# Patient Record
Sex: Female | Born: 1965
Health system: Southern US, Community
[De-identification: ages and names within clinical notes are randomized; demographics above are authoritative.]

## PROBLEM LIST (undated history)

## (undated) HISTORY — PX: ABDOMINAL HYSTERECTOMY: SHX81

## (undated) HISTORY — PX: PELVIC FLOOR REPAIR: SHX2192

## (undated) HISTORY — PX: VAGINAL HYSTERECTOMY: SUR661

## (undated) HISTORY — PX: TOTAL ABDOMINAL HYSTERECTOMY: SHX209

---

## 2010-03-30 ENCOUNTER — Ambulatory Visit: Payer: Self-pay | Admitting: Diagnostic Radiology

## 2010-03-30 ENCOUNTER — Emergency Department (HOSPITAL_BASED_OUTPATIENT_CLINIC_OR_DEPARTMENT_OTHER): Admission: EM | Admit: 2010-03-30 | Discharge: 2010-03-30 | Payer: Self-pay | Admitting: Emergency Medicine

## 2011-09-18 ENCOUNTER — Inpatient Hospital Stay (INDEPENDENT_AMBULATORY_CARE_PROVIDER_SITE_OTHER)
Admission: RE | Admit: 2011-09-18 | Discharge: 2011-09-18 | Disposition: A | Discharge status: Home / Self Care | Payer: Managed Care, Other (non HMO) | Source: Ambulatory Visit | Attending: Family Medicine | Admitting: Family Medicine

## 2011-09-18 ENCOUNTER — Encounter: Payer: Self-pay | Admitting: Family Medicine

## 2011-09-18 DIAGNOSIS — J069 Acute upper respiratory infection, unspecified: Secondary | ICD-10-CM

## 2011-11-09 NOTE — Progress Notes (Signed)
Summary: Possible Sinus Infection Room 5   Vital Signs:  Patient Profile:   45 Years Old Female CC:      Congestion since yesterday Height:     61 inches Weight:      143 pounds O2 Sat:      100 % O2 treatment:    Room Air Temp:     98.4 degrees F oral Pulse rate:   86 / minute Pulse rhythm:   regular Resp:     12 per minute BP sitting:   124 / 78  (left arm) Cuff size:   regular  Vitals Entered By: Emilio Math (September 18, 2011 2:26 PM)                  Current Allergies: No known allergies History of Present Illness Chief Complaint: Congestion since yesterday History of Present Illness:  Subjective: Patient complains of mild sore throat that started 2 days ago, now resolved + cough started yesterday No pleuritic pain No wheezing + nasal congestion No post-nasal drainage + sinus pain/pressure No itchy/red eyes ? right earache No hemoptysis No SOB No fever/chills but felt hot last night No nausea No vomiting No abdominal pain No diarrhea No skin rashes + fatigue No myalgias No headache Used OTC meds without relief   REVIEW OF SYSTEMS Constitutional Symptoms      Denies fever, chills, night sweats, weight loss, weight gain, and fatigue.  Eyes       Denies change in vision, eye pain, eye discharge, glasses, contact lenses, and eye surgery. Ear/Nose/Throat/Mouth       Complains of sinus problems.      Denies hearing loss/aids, change in hearing, ear pain, ear discharge, dizziness, frequent runny nose, frequent nose bleeds, sore throat, hoarseness, and tooth pain or bleeding.  Respiratory       Denies dry cough, productive cough, wheezing, shortness of breath, asthma, bronchitis, and emphysema/COPD.  Cardiovascular       Denies murmurs, chest pain, and tires easily with exhertion.    Gastrointestinal       Denies stomach pain, nausea/vomiting, diarrhea, constipation, blood in bowel movements, and indigestion. Genitourniary       Denies painful  urination, kidney stones, and loss of urinary control. Neurological       Denies paralysis, seizures, and fainting/blackouts. Musculoskeletal       Denies muscle pain, joint pain, joint stiffness, decreased range of motion, redness, swelling, muscle weakness, and gout.  Skin       Denies bruising, unusual mles/lumps or sores, and hair/skin or nail changes.  Psych       Denies mood changes, temper/anger issues, anxiety/stress, speech problems, depression, and sleep problems.  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  Extraocular movement is intact.  Conjunctivae are not inflamed.  Ears:  Canals normal.  Tympanic membranes normal.   Nose:  Mildly congested turbinates, worse on the right.  No sinus tenderness  Pharynx:  Normal  Neck:  Supple.  Slightly tender shotty posterior nodes are palpated bilaterally.  Lungs:  Clear to auscultation.  Breath sounds are equal.  Heart:  Regular rate and rhythm without murmurs, rubs, or gallops.  Abdomen:  Nontender without masses or hepatosplenomegaly.  Bowel sounds are present.  No CVA or flank tenderness.  Assessment New Problems: UPPER RESPIRATORY INFECTION, ACUTE (ICD-465.9)  NO EVIDENCE BACTERIAL INFECTION TODAY  Plan New Medications/Changes: BENZONATATE 200 MG CAPS (BENZONATATE) One by mouth hs as needed cough  #12 x 0, 09/18/2011, Donna Christen MD AZITHROMYCIN 250 MG TABS (AZITHROMYCIN) Two tabs by mouth on day 1, then 1 tab daily on days 2 through 5 (Rx void after 09/26/11)  #6 tabs x 0, 09/18/2011, Donna Christen MD  New Orders: Pulse Oximetry (single measurment) [40981] New Patient Level III [19147] Planning  Comments:   Treat symptomatically for now:  Increase fluid intake, begin expectorant/decongestant, topical decongestant,  cough suppressant at bedtime.  If fever/chills/sweats persist, or if not improving 5  days begin Z-pack (given Rx to hold).  Followup with PCP if not improving 7 to 10 days.   The patient and/or caregiver has been counseled thoroughly with regard to medications prescribed including dosage, schedule, interactions, rationale for use, and possible side effects and they verbalize understanding.  Diagnoses and expected course of recovery discussed and will return if not improved as expected or if the condition worsens. Patient and/or caregiver verbalized understanding.  Prescriptions: BENZONATATE 200 MG CAPS (BENZONATATE) One by mouth hs as needed cough  #12 x 0   Entered and Authorized by:   Donna Christen MD   Signed by:   Donna Christen MD on 09/18/2011   Method used:   Print then Give to Patient   RxID:   [id number] AZITHROMYCIN 250 MG TABS (AZITHROMYCIN) Two tabs by mouth on day 1, then 1 tab daily on days 2 through 5 (Rx void after 09/26/11)  #6 tabs x 0   Entered and Authorized by:   Donna Christen MD   Signed by:   Donna Christen MD on 09/18/2011   Method used:   Print then Give to Patient   RxID:   [id number]   Patient Instructions: 1)  Take Mucinex D (guaifenesin with decongestant) twice daily for congestion. 2)  Increase fluid intake, rest. 3)  May use Afrin nasal spray (or generic oxymetazoline) twice daily for about 5 days.  Also recommend using saline nasal spray several times daily and/or saline nasal irrigation. 4)  Begin Azithromycin if not improving about 5 days or if persistent fever develops. 5)  Followup with family doctor if not improving 7 to 10 days.   Orders Added: 1)  Pulse Oximetry (single measurment) [94760] 2)  New Patient Level III [27253]

## 2012-09-09 DIAGNOSIS — I341 Nonrheumatic mitral (valve) prolapse: Secondary | ICD-10-CM | POA: Insufficient documentation

## 2014-10-19 ENCOUNTER — Encounter: Payer: Self-pay | Admitting: Physician Assistant

## 2014-10-19 ENCOUNTER — Ambulatory Visit (INDEPENDENT_AMBULATORY_CARE_PROVIDER_SITE_OTHER): Payer: BC Managed Care – PPO | Admitting: Physician Assistant

## 2014-10-19 VITALS — BP 119/76 | HR 81 | Ht 61.0 in | Wt 153.0 lb

## 2014-10-19 DIAGNOSIS — Z1211 Encounter for screening for malignant neoplasm of colon: Secondary | ICD-10-CM | POA: Diagnosis not present

## 2014-10-19 DIAGNOSIS — K644 Residual hemorrhoidal skin tags: Secondary | ICD-10-CM | POA: Insufficient documentation

## 2014-10-19 DIAGNOSIS — K648 Other hemorrhoids: Secondary | ICD-10-CM | POA: Diagnosis not present

## 2014-10-19 DIAGNOSIS — K219 Gastro-esophageal reflux disease without esophagitis: Secondary | ICD-10-CM | POA: Diagnosis not present

## 2014-10-19 DIAGNOSIS — K6289 Other specified diseases of anus and rectum: Secondary | ICD-10-CM

## 2014-10-19 MED ORDER — LIDOCAINE-HYDROCORTISONE ACE 3-1 % RE KIT: 1.0000 "application " | PACK | Freq: Two times a day (BID) | RECTAL | Status: DC

## 2014-10-19 MED ORDER — RANITIDINE HCL 150 MG PO CAPS: 150.0000 mg | ORAL_CAPSULE | Freq: Two times a day (BID) | ORAL | Status: DC

## 2014-10-19 NOTE — Patient Instructions (Signed)
Will get colonoscopy scheduled.   Anal Fissure, Adult An anal fissure is a small tear or crack in the skin around the anus. Bleeding from a fissure usually stops on its own within a few minutes. However, bleeding will often reoccur with each bowel movement until the crack heals.  CAUSES   Passing large, hard stools.  Frequent diarrheal stools.  Constipation.  Inflammatory bowel disease (Crohn's disease or ulcerative colitis).  Infections.  Anal sex. SYMPTOMS   Small amounts of blood seen on your stools, on toilet paper, or in the toilet after a bowel movement.  Rectal bleeding.  Painful bowel movements.  Itching or irritation around the anus. DIAGNOSIS Your caregiver will examine the anal area. An anal fissure can usually be seen with careful inspection. A rectal exam may be performed and a short tube (anoscope) may be used to examine the anal canal. TREATMENT   You may be instructed to take fiber supplements. These supplements can soften your stool to help make bowel movements easier.  Sitz baths may be recommended to help heal the tear. Do not use soap in the sitz baths.  A medicated cream or ointment may be prescribed to lessen discomfort. HOME CARE INSTRUCTIONS   Maintain a diet high in fruits, whole grains, and vegetables. Avoid constipating foods like bananas and dairy products.  Take sitz baths as directed by your caregiver.  Drink enough fluids to keep your urine clear or pale yellow.  Only take over-the-counter or prescription medicines for pain, discomfort, or fever as directed by your caregiver. Do not take aspirin as this may increase bleeding.  Do not use ointments containing numbing medications (anesthetics) or hydrocortisone. They could slow healing. SEEK MEDICAL CARE IF:   Your fissure is not completely healed within 3 days.  You have further bleeding.  You have a fever.  You have diarrhea mixed with blood.  You have pain.  Your problem is  getting worse rather than better. MAKE SURE YOU:   Understand these instructions.  Will watch your condition.  Will get help right away if you are not doing well or get worse. Document Released: 11/23/2005 Document Revised: 02/15/2012 Document Reviewed: 05/10/2011 [facility] Patient Information 2015 [place], [place]. This information is not intended to replace advice given to you by your health care provider. Make sure you discuss any questions you have with your health care provider.

## 2014-10-22 DIAGNOSIS — K6289 Other specified diseases of anus and rectum: Secondary | ICD-10-CM | POA: Insufficient documentation

## 2014-10-22 NOTE — Progress Notes (Signed)
   Subjective:    Patient ID: Donna Ford, female    DOB: [birth year], 48 y.o.   MRN: [id number]  HPI Patient is a 48 year old female who presents to the clinic to establish care.  .. Active Ambulatory Problems    Diagnosis Date Noted  . Esophageal reflux 10/19/2014  . External hemorrhoid 10/19/2014   Resolved Ambulatory Problems    Diagnosis Date Noted  . No Resolved Ambulatory Problems   No Additional Past Medical History   .Marland Kitchen Family History  Problem Relation Age of Onset  . Heart attack Father 15    open heart at 14  . Hypertension Father    .Marland Kitchen History   Social History  . Marital Status: Married    Spouse Name: N/A    Number of Children: N/A  . Years of Education: N/A   Occupational History  . Not on file.   Social History Main Topics  . Smoking status: Never Smoker   . Smokeless tobacco: Not on file  . Alcohol Use: No  . Drug Use: No  . Sexual Activity: Yes   Other Topics Concern  . Not on file   Social History Narrative  . No narrative on file   She is most concerned with the rectal pain she has been having on and off for the last 6 months. It has worsened over the last couple weeks. She has a history of hemorrhoids. She also has some constipation. MiraLAX seems to make her have diarrhea. She has tried Metamucil and seems to help significantly with softer normal bowel movements. She admits to some itching and pain. She is not tried anything over-the-counter or use any prescription creams. She denies any blood in stool.  Patient also has acid reflux symptoms which she takes Zantac over-the-counter as needed. Seems to help her significantly. She would like a prescription for this.   Review of Systems  All other systems reviewed and are negative.      Objective:   Physical Exam  Constitutional: She is oriented to person, place, and time. She appears well-developed and well-nourished.  HENT:  Head: Normocephalic and atraumatic.  Cardiovascular: Normal  rate and normal heart sounds.   Pulmonary/Chest: Effort normal and breath sounds normal.  Genitourinary: Guaiac negative stool.  Small external hemorrhoid at 1 oclock. Painful DRE. No masses palpated.   Neurological: She is alert and oriented to person, place, and time.  Skin: Skin is dry.  Psychiatric: She has a normal mood and affect. Her behavior is normal.          Assessment & Plan:  External hemorrhoids/rectal pain/constipation- hemoccult negative today. cannot completely exclude some anal fissures as well. Lidocaine/hydrocortisone given to try for relief. Discussed stools. Seem to be good with metamucil. Discussed other medication options if would like to try. miralax creates diarrhea. Concerned linzess/amitiza might as well. Continue metamucil for time being.  Colonoscopy ordered today.  Esophageal reflux- controlled with zantac as needed. Follow up if needs PPI for symptoms control.

## 2014-11-12 ENCOUNTER — Telehealth: Payer: Self-pay | Admitting: Physician Assistant

## 2014-11-16 ENCOUNTER — Encounter: Payer: BC Managed Care – PPO | Admitting: Physician Assistant

## 2015-02-05 NOTE — Telephone Encounter (Signed)
No note. Needs closed.

## 2015-07-22 ENCOUNTER — Encounter: Payer: Self-pay | Admitting: *Deleted

## 2015-07-22 ENCOUNTER — Emergency Department (INDEPENDENT_AMBULATORY_CARE_PROVIDER_SITE_OTHER): Payer: 59

## 2015-07-22 ENCOUNTER — Emergency Department
Admission: EM | Admit: 2015-07-22 | Discharge: 2015-07-22 | Disposition: A | Discharge status: Home / Self Care | Payer: 59 | Source: Home / Self Care | Attending: Family Medicine | Admitting: Family Medicine

## 2015-07-22 DIAGNOSIS — R0981 Nasal congestion: Secondary | ICD-10-CM | POA: Diagnosis not present

## 2015-07-22 MED ORDER — FLUTICASONE PROPIONATE 50 MCG/ACT NA SUSP: NASAL | Status: DC

## 2015-07-22 MED ORDER — AMOXICILLIN 875 MG PO TABS: 875.0000 mg | ORAL_TABLET | Freq: Two times a day (BID) | ORAL | Status: DC

## 2015-07-22 MED ORDER — PREDNISONE 20 MG PO TABS: 20.0000 mg | ORAL_TABLET | Freq: Two times a day (BID) | ORAL | Status: DC

## 2015-07-22 NOTE — ED Notes (Signed)
Pt c/o intermittent sinus congestion and pain x 3 months. Taken neti pot , Claritin, decongestants and Nasacort without relief.

## 2015-07-22 NOTE — Discharge Instructions (Signed)
May use Afrin nasal spray (or generic oxymetazoline) twice daily for about 5 days and then discontinue.  Also recommend using saline nasal spray several times daily and saline nasal irrigation (AYR is a common brand).  Use Flonase nasal spray each morning after using Afrin nasal spray and saline nasal irrigation. Alternate antihistamines on a monthly basis (such as Claritin, Allegra, and Zyrtec).

## 2015-07-22 NOTE — ED Provider Notes (Signed)
CSN: [id number]     Arrival date & time 07/22/15  0761 History   First MD Initiated Contact with Patient 07/22/15 1015     Chief Complaint  Patient presents with  . Sinus Problem      HPI Comments: Patient complains of approximately 5 month history of intermittent recurring sinus pressure and nasal congestion.  Her symptoms improve somewhat with Flonase.  No cough or sore throat  The history is provided by the patient.    History reviewed. No pertinent past medical history. Past Surgical History  Procedure Laterality Date  . Abdominal hysterectomy     Family History  Problem Relation Age of Onset  . Heart attack Father 108    open heart at 24  . Hypertension Father    Social History  Substance Use Topics  . Smoking status: Never Smoker   . Smokeless tobacco: None  . Alcohol Use: No   OB History    No data available     Review of Systems No sore throat No cough No pleuritic pain No wheezing + nasal congestion + post-nasal drainage + sinus pain/pressure No itchy/red eyes, but has had clear eye drainage No earache No hemoptysis No SOB No fever, + chills No nausea No vomiting No abdominal pain No diarrhea No urinary symptoms No skin rash No fatigue No myalgias No headache Used OTC meds without relief  Allergies  Review of patient's allergies indicates no known allergies.  Home Medications   Prior to Admission medications   Medication Sig Start Date End Date Taking? Authorizing Provider  Multiple Vitamin (MULTIVITAMIN) tablet Take 1 tablet by mouth daily.   Yes Historical Provider, MD  ranitidine (ZANTAC) 150 MG capsule Take 1 capsule (150 mg total) by mouth 2 (two) times daily. 10/19/14  Yes Jade L Breeback, PA-C  amoxicillin (AMOXIL) 875 MG tablet Take 1 tablet (875 mg total) by mouth 2 (two) times daily. 07/22/15   Kandra Nicolas, MD  fluticasone Asencion Islam) 50 MCG/ACT nasal spray Place two sprays in each nostril once daily 07/22/15   Kandra Nicolas, MD   lidocaine-hydrocortisone (ANAMANTLE) 3-1 % KIT Place 1 application rectally 2 (two) times daily. 10/19/14   Jade L Breeback, PA-C  predniSONE (DELTASONE) 20 MG tablet Take 1 tablet (20 mg total) by mouth 2 (two) times daily. Take with food. 07/22/15   Kandra Nicolas, MD   BP 107/71 mmHg  Pulse 75  Temp(Src) 98.5 F (36.9 C) (Oral)  Resp 14  Wt 154 lb (69.854 kg)  SpO2 97% Physical Exam Nursing notes and Vital Signs reviewed. Appearance:  Patient appears stated age, and in no acute distress Eyes:  Pupils are equal, round, and reactive to light and accomodation.  Extraocular movement is intact.  Conjunctivae are not inflamed  Ears:  Canals normal.  Tympanic membranes normal.  Nose:  Congested turbinates.  Maxillary sinus tenderness is present. Pharynx:  Normal Neck:  Supple.  No adenopathy Lungs:  Clear to auscultation.  Breath sounds are equal.  Moving air well. Heart:  Regular rate and rhythm without murmurs, rubs, or gallops.  Abdomen:  Nontender without masses or hepatosplenomegaly.  Bowel sounds are present.  No CVA or flank tenderness.  Extremities:  No edema.  Skin:  No rash present.   ED Course  Procedures none  Imaging Review Dg Sinuses Complete  07/22/2015   CLINICAL DATA:  Sinus congestion and pressure for 5 months  EXAM: PARANASAL SINUSES - COMPLETE 3 + VIEW  COMPARISON:  None.  FINDINGS: Frontal, water's, and lateral views obtained. Paranasal sinuses are clear. There is no air-fluid level. No bony destruction or expansion. Mastoids also appear clear. Nasal septum is midline.  IMPRESSION: Paranasal sinuses clear.   Electronically Signed   By: Lowella Grip III M.D.   On: 07/22/2015 10:46     MDM   1. Sinus congestion    Begin prednisone burst.  [facility] Rx for amoxicillin $RemoveBeforeD'875mg'IxVhilRrZyZkkm$  BID for 10 days.  Rx for Flonase nasal spray.  May use Afrin nasal spray (or generic oxymetazoline) twice daily for about 5 days and then discontinue.  Also recommend using saline nasal  spray several times daily and saline nasal irrigation (AYR is a common brand).  Use Flonase nasal spray each morning after using Afrin nasal spray and saline nasal irrigation. Alternate antihistamines on a monthly basis (such as Claritin, Allegra, and Zyrtec). Followup with ENT if not improved 10 days.    Kandra Nicolas, MD 07/22/15 [phone]

## 2015-08-07 ENCOUNTER — Ambulatory Visit (INDEPENDENT_AMBULATORY_CARE_PROVIDER_SITE_OTHER): Payer: 59 | Admitting: Physician Assistant

## 2015-08-07 ENCOUNTER — Encounter: Payer: Self-pay | Admitting: Physician Assistant

## 2015-08-07 VITALS — BP 122/75 | HR 78 | Ht 61.0 in | Wt 154.0 lb

## 2015-08-07 DIAGNOSIS — Z114 Encounter for screening for human immunodeficiency virus [HIV]: Secondary | ICD-10-CM

## 2015-08-07 DIAGNOSIS — Z13220 Encounter for screening for lipoid disorders: Secondary | ICD-10-CM | POA: Diagnosis not present

## 2015-08-07 DIAGNOSIS — Z131 Encounter for screening for diabetes mellitus: Secondary | ICD-10-CM

## 2015-08-07 DIAGNOSIS — Z1159 Encounter for screening for other viral diseases: Secondary | ICD-10-CM | POA: Diagnosis not present

## 2015-08-07 DIAGNOSIS — Z1239 Encounter for other screening for malignant neoplasm of breast: Secondary | ICD-10-CM

## 2015-08-07 DIAGNOSIS — N959 Unspecified menopausal and perimenopausal disorder: Secondary | ICD-10-CM

## 2015-08-07 DIAGNOSIS — Z0000 Encounter for general adult medical examination without abnormal findings: Secondary | ICD-10-CM | POA: Diagnosis not present

## 2015-08-07 DIAGNOSIS — J309 Allergic rhinitis, unspecified: Secondary | ICD-10-CM

## 2015-08-07 DIAGNOSIS — R0981 Nasal congestion: Secondary | ICD-10-CM

## 2015-08-07 MED ORDER — AZELASTINE-FLUTICASONE 137-50 MCG/ACT NA SUSP: NASAL | Status: DC

## 2015-08-07 MED ORDER — ESCITALOPRAM OXALATE 10 MG PO TABS
10.0000 mg | ORAL_TABLET | Freq: Every day | ORAL | Status: DC
Start: 2015-08-07 — End: 2016-02-06

## 2015-08-07 NOTE — Progress Notes (Signed)
  Subjective:     Donna Ford is a 49 y.o. female and is here for a comprehensive physical exam. The patient reports problems - pt has had a lot of nasal congestion and sinus pressure over the summer. she recently was given prednisone, nasal spray and abx. there has been some improvement but continues to have nasal congestion and hard to breathe. she was on allergy medication but recently stopped. pt also having hot flashes and increased anxiety. she wonders what she can do with symptoms. .  Social History   Social History  . Marital Status: Married    Spouse Name: N/A  . Number of Children: N/A  . Years of Education: N/A   Occupational History  . Not on file.   Social History Main Topics  . Smoking status: Never Smoker   . Smokeless tobacco: Not on file  . Alcohol Use: No  . Drug Use: No  . Sexual Activity: Yes   Other Topics Concern  . Not on file   Social History Narrative   Health Maintenance  Topic Date Due  . Hepatitis C Screening  Aug 08, 1966  . HIV Screening  06/27/1979  . INFLUENZA VACCINE  07/08/2015  . MAMMOGRAM  01/08/2016  . TETANUS/TDAP  12/08/2023  . COLONOSCOPY  05/14/2025    The following portions of the patient's history were reviewed and updated as appropriate: allergies, current medications, past family history, past medical history, past social history, past surgical history and problem list.  Review of Systems A comprehensive review of systems was negative.   Objective:    BP 122/75 mmHg  Pulse 78  Ht 5\' 1"  (1.549 m)  Wt 154 lb (69.854 kg)  BMI 29.11 kg/m2 General appearance: alert, cooperative and appears stated age Head: Normocephalic, without obvious abnormality, atraumatic Eyes: conjunctivae/corneas clear. PERRL, EOM's intact. Fundi benign. Ears: normal TM's and external ear canals both ears Nose: Nares normal. Septum midline. Mucosa normal. No drainage or sinus tenderness., right turbinate red, swollen almost completely shut. Throat:  lips, mucosa, and tongue normal; teeth and gums normal Neck: no adenopathy, no carotid bruit, no JVD, supple, symmetrical, trachea midline and thyroid not enlarged, symmetric, no tenderness/mass/nodules Back: symmetric, no curvature. ROM normal. No CVA tenderness. Lungs: clear to auscultation bilaterally Heart: regular rate and rhythm, S1, S2 normal, no murmur, click, rub or gallop Abdomen: soft, non-tender; bowel sounds normal; no masses,  no organomegaly Pelvic: external genitalia normal, no adnexal masses or tenderness, no cervical motion tenderness, uterus surgically absent and vagina normal without discharge Extremities: extremities normal, atraumatic, no cyanosis or edema Pulses: 2+ and symmetric Skin: Skin color, texture, turgor normal. No rashes or lesions Lymph nodes: Cervical, supraclavicular, and axillary nodes normal. Neurologic: Grossly normal    Assessment:    Healthy female exam.      Plan:    CPE- fasting labs ordered. Will get flu shot at work. Mammogram ordered. Colonoscopy up to date. Discussed vitamin D 800 and calcium 1500mg  or 4 servings of daily. Continue regular exercise.   Post-menopausal symptoms- given HO of herbal remedies. Started lexapro for mood. Start with 1/2 tablet and may increase to 1 full tablet. Follow up as needed.   Allergic rhinitis- try dymista. If not improving can consider ENT referral. No evidence of deviated septum today. Re-start allergy medication and continue nasal saline washes.  See After Visit Summary for Counseling Recommendations

## 2015-08-07 NOTE — Patient Instructions (Addendum)
Keeping You Healthy  Get These Tests  Blood Pressure- Have your blood pressure checked by your healthcare provider at least once a year.  Normal blood pressure is 120/80.  Weight- Have your body mass index (BMI) calculated to screen for obesity.  BMI is a measure of body fat based on height and weight.  You can calculate your own BMI at www.nhlbisupport.com/bmi/  Cholesterol- Have your cholesterol checked every year.  Diabetes- Have your blood sugar checked every year if you have high blood pressure, high cholesterol, a family history of diabetes or if you are overweight.  Pap Test - Have a pap test every 1 to 5 years if you have been sexually active.  If you are older than 65 and recent pap tests have been normal you may not need additional pap tests.  In addition, if you have had a hysterectomy  for benign disease additional pap tests are not necessary.  Mammogram-Yearly mammograms are essential for early detection of breast cancer  Screening for Colon Cancer- Colonoscopy starting at age 50. Screening may begin sooner depending on your family history and other health conditions.  Follow up colonoscopy as directed by your Gastroenterologist.  Screening for Osteoporosis- Screening begins at age 65 with bone density scanning, sooner if you are at higher risk for developing Osteoporosis.  Get these medicines  Calcium with Vitamin D- Your body requires 1200-1500 mg of Calcium a day and 800-1000 IU of Vitamin D a day.  You can only absorb 500 mg of Calcium at a time therefore Calcium must be taken in 2 or 3 separate doses throughout the day.  Hormones- Hormone therapy has been associated with increased risk for certain cancers and heart disease.  Talk to your healthcare provider about if you need relief from menopausal symptoms.  Aspirin- Ask your healthcare provider about taking Aspirin to prevent Heart Disease and Stroke.  Get these Immuniztions  Flu shot- Every fall  Pneumonia shot-  Once after the age of 65; if you are younger ask your healthcare provider if you need a pneumonia shot.  Tetanus- Every ten years.  Zostavax- Once after the age of 60 to prevent shingles.  Take these steps  Don't smoke- Your healthcare provider can help you quit. For tips on how to quit, ask your healthcare provider or go to www.smokefree.gov or call 1-800 QUIT-NOW.  Be physically active- Exercise 5 days a week for a minimum of 30 minutes.  If you are not already physically active, start slow and gradually work up to 30 minutes of moderate physical activity.  Try walking, dancing, bike riding, swimming, etc.  Eat a healthy diet- Eat a variety of healthy foods such as fruits, vegetables, whole grains, low fat milk, low fat cheeses, yogurt, lean meats, chicken, fish, eggs, dried beans, tofu, etc.  For more information go to www.thenutritionsource.org  Dental visit- Brush and floss teeth twice daily; visit your dentist twice a year.  Eye exam- Visit your Optometrist or Ophthalmologist yearly.  Drink alcohol in moderation- Limit alcohol intake to one drink or less a day.  Never drink and drive.  Depression- Your emotional health is as important as your physical health.  If you're feeling down or losing interest in things you normally enjoy, please talk to your healthcare provider.  Seat Belts- can save your life; always wear one  Smoke/Carbon Monoxide detectors- These detectors need to be installed on the appropriate level of your home.  Replace batteries at least once a year.  Violence- If   anyone is threatening or hurting you, please tell your healthcare provider.  Living Will/ Health care power of attorney- Discuss with your healthcare provider and family.  Menopause and Herbal Products Menopause is the normal time of life when menstrual periods stop completely. Menopause is complete when you have missed 12 consecutive menstrual periods. It usually occurs between the ages of 51 to 89,  with an average age of 56. Very rarely does a woman develop menopause before 51 years old. At menopause, your ovaries stop producing the female hormones, estrogen and progesterone. This can cause undesirable symptoms and also affect your health. Sometimes the symptoms can occur 4 to 5 years before the menopause begins. There is no relationship between menopause and:  Oral contraceptives.  Number of children you had.  Race.  The age your menstrual periods started (menarche). Heavy smokers and very thin women may develop menopause earlier in life. Estrogen and progesterone hormone treatment is the usual method of treating menopausal symptoms. However, there are women who should not take hormone treatment. This is true of:   Women that have breast or uterine cancer.  Women who prefer not to take hormones because of certain side effects (abnormal uterine bleeding).  Women who are afraid that hormones may cause breast cancer.  Women who have a history of liver disease, heart disease, stroke, or blood clots. For these women, there are other medications that may help treat their menopausal symptoms. These medications are found in plants and botanical products. They can be found in the form of herbs, teas, oils, tinctures, and pills.  CAUSES:  The ovaries stop producing the female hormones estrogen and progesterone.  Other causes include:  Surgery to remove both ovaries.  The ovaries stop functioning for no know reason.  Tumors of the pituitary gland in the brain.  Medical disease that affects the ovaries and hormone production.  Radiation treatment to the abdomen or pelvis.  Chemotherapy that affects the ovaries. PHYTOESTROGENS: Phytoestrogens occur naturally in plants and plant products. They act like estrogen in the body. Herbal medications are made from these plants and botanical steroids. There are 3 types of phytoestrogens:  Isoflavones (genistein and daidzein) are found in soy,  garbanzo beans, miso and tofu foods.  Ligins are found in the shell of seeds. They are used to make oils like flaxseed oil. The bacteria in your intestine act on these foods to produce the estrogen-like hormones.  Coumestans are estrogen-like. Some of the foods they are found in include sunflower seeds and bean sprouts. CONDITIONS AND THEIR POSSIBLE HERBAL TREATMENT:  Hot flashes and night sweats.  Soy, black cohosh and evening primrose.  Irritability, insomnia, depression and memory problems.  Chasteberry, ginseng, and soy.  St. John's wort may be helpful for depression. However, there is a concern of it causing cataracts of the eye and may have bad effects on other medications. St. John's wort should not be taken for long time and without your caregiver's advice.  Loss of libido and vaginal and skin dryness.  Wild yam and soy.  Prevention of coronary heart disease and osteoporosis.  Soy and Isoflavones. Several studies have shown that some women benefit from herbal medications, but most of the studies have not consistently shown that these supplements are much better than placebo. Other forms of treatment to help women with menopausal symptoms include a balanced diet, rest, exercise, vitamin and calcium (with vitamin D) supplements, acupuncture, and group therapy when necessary. THOSE WHO SHOULD NOT TAKE HERBAL MEDICATIONS INCLUDE:  Women who are planning on getting pregnant unless told by your caregiver.  Women who are breastfeeding unless told by your caregiver.  Women who are taking other prescription medications unless told by your caregiver.  Infants, children, and elderly women unless told by your caregiver. Different herbal medications have different and unmeasured amounts of the herbal ingredients. There are no regulations, quality control, and standardization of the ingredients in herbal medications. Therefore, the amount of the ingredient in the medication may vary from  one herb, pill, tea, oil or tincture to another. Many herbal medications can cause serious problems and can even have poisonous effects if taken too much or too long. If problems develop, the medication should be stopped and recorded by your caregiver. HOME CARE INSTRUCTIONS  Do not take or give children herbal medications without your caregiver's advice.  Let your caregiver know all the medications you are taking. This includes prescription, over-the-counter, eye drops, and creams.  Do not take herbal medications longer or more than recommended.  Tell your caregiver about any side effects from the medication. SEEK MEDICAL CARE IF:  You develop a fever of 102 F (38.9 C), or as directed by your caregiver.  You feel sick to your stomach (nauseous), vomit, or have diarrhea.  You develop a rash.  You develop abdominal pain.  You develop severe headaches.  You start to have vision problems.  You feel dizzy or faint.  You start to feel numbness in any part of your body.  You start shaking (have convulsions). Document Released: 05/11/2008 Document Revised: 11/09/2012 Document Reviewed: 12/09/2010 [facility] Patient Information 2015 [place], [place]. This information is not intended to replace advice given to you by your health care provider. Make sure you discuss any questions you have with your health care provider.

## 2015-10-09 ENCOUNTER — Ambulatory Visit (INDEPENDENT_AMBULATORY_CARE_PROVIDER_SITE_OTHER): Payer: 59

## 2015-10-09 DIAGNOSIS — Z1231 Encounter for screening mammogram for malignant neoplasm of breast: Secondary | ICD-10-CM

## 2015-10-09 DIAGNOSIS — Z1239 Encounter for other screening for malignant neoplasm of breast: Secondary | ICD-10-CM

## 2015-10-10 ENCOUNTER — Encounter: Payer: Self-pay | Admitting: Physician Assistant

## 2015-10-10 DIAGNOSIS — E785 Hyperlipidemia, unspecified: Secondary | ICD-10-CM | POA: Insufficient documentation

## 2015-10-10 LAB — HIV ANTIBODY (ROUTINE TESTING W REFLEX): HIV: NONREACTIVE

## 2015-10-10 LAB — COMPLETE METABOLIC PANEL WITH GFR
ALT: 19 U/L (ref 6–29)
AST: 20 U/L (ref 10–35)
Albumin: 3.9 g/dL (ref 3.6–5.1)
Alkaline Phosphatase: 68 U/L (ref 33–130)
BUN: 12 mg/dL (ref 7–25)
CALCIUM: 8.9 mg/dL (ref 8.6–10.4)
CHLORIDE: 103 mmol/L (ref 98–110)
CO2: 27 mmol/L (ref 20–31)
CREATININE: 0.7 mg/dL (ref 0.50–1.05)
GFR, Est African American: >89 mL/min (ref >=60)
GFR, Est Non African American: >89 mL/min (ref >=60)
GLUCOSE: 83 mg/dL (ref 65–99)
POTASSIUM: 4.5 mmol/L (ref 3.5–5.3)
SODIUM: 138 mmol/L (ref 135–146)
Total Bilirubin: 0.6 mg/dL (ref 0.2–1.2)
Total Protein: 6.2 g/dL (ref 6.1–8.1)

## 2015-10-10 LAB — LIPID PANEL
CHOL/HDL RATIO: 6.1 ratio — AB (ref <=5.0)
CHOLESTEROL: 279 mg/dL — AB (ref 125–200)
HDL: 46 mg/dL (ref >=46)
LDL Cholesterol: 197 mg/dL — ABNORMAL HIGH (ref <130)
Triglycerides: 181 mg/dL — ABNORMAL HIGH (ref <150)
VLDL: 36 mg/dL — AB (ref <30)

## 2015-10-10 LAB — HEPATITIS C ANTIBODY: HCV Ab: NEGATIVE

## 2015-10-18 ENCOUNTER — Other Ambulatory Visit: Payer: Self-pay | Admitting: Physician Assistant

## 2015-10-18 MED ORDER — ATORVASTATIN CALCIUM 40 MG PO TABS: 40.0000 mg | ORAL_TABLET | Freq: Every day | ORAL | Status: DC

## 2015-10-21 ENCOUNTER — Encounter: Payer: Self-pay | Admitting: Physician Assistant

## 2015-10-21 ENCOUNTER — Ambulatory Visit (INDEPENDENT_AMBULATORY_CARE_PROVIDER_SITE_OTHER): Payer: 59 | Admitting: Physician Assistant

## 2015-10-21 VITALS — BP 125/68 | HR 75 | Ht 61.0 in | Wt 155.0 lb

## 2015-10-21 DIAGNOSIS — J301 Allergic rhinitis due to pollen: Secondary | ICD-10-CM

## 2015-10-21 DIAGNOSIS — J309 Allergic rhinitis, unspecified: Secondary | ICD-10-CM | POA: Diagnosis not present

## 2015-10-21 DIAGNOSIS — R0981 Nasal congestion: Secondary | ICD-10-CM | POA: Diagnosis not present

## 2015-10-21 MED ORDER — MONTELUKAST SODIUM 10 MG PO TABS: 10.0000 mg | ORAL_TABLET | Freq: Every day | ORAL | Status: DC

## 2015-10-21 MED ORDER — PREDNISONE 20 MG PO TABS: ORAL_TABLET | ORAL | Status: DC

## 2015-10-21 NOTE — Progress Notes (Signed)
   Subjective:    Patient ID: Donna Ford, female    DOB: Jun 10, 1966, 49 y.o.   MRN: [id number]  HPI  Patient is a 49 year old female who presents to the clinic with sinus pressure, congestion, tooth pain that has worsened over the last week that been present since May. She had x-rays of the sinuses which showed no air-fluid levels when she went to urgent care. She saw me a few weeks ago and was given.Mr. She did feel like that helped until recently. She gets better over the weekends and when she's off work and worsens throughout the week. She feels like there is a certain room at work that has some mold might be exacerbating symptoms. She is not on any antihistamine at this time. Today her headache is pounding. It hurts to bend over. She has tried 600 mg of ibuprofen. She denies any rhinorrhea, cough, fever, chills, ear pain    Review of Systems  All other systems reviewed and are negative.      Objective:   Physical Exam  Constitutional: She is oriented to person, place, and time. She appears well-developed and well-nourished.  HENT:  Head: Normocephalic and atraumatic.  Right Ear: External ear normal.  Left Ear: External ear normal.  Mouth/Throat: Oropharynx is clear and moist. No oropharyngeal exudate.  TM's clear bilaterally.  Left maxillary tenderness to palpation.  Bilateral nasal turbinates red and swollen.   Eyes:  Dark circles around both eyes.  Slightly injected conjunctiva bilateral.   Neck: Normal range of motion. Neck supple.  Cardiovascular: Normal rate, regular rhythm and normal heart sounds.   Pulmonary/Chest: Effort normal and breath sounds normal.  Lymphadenopathy:    She has no cervical adenopathy.  Neurological: She is alert and oriented to person, place, and time.  Skin: Skin is dry.  Psychiatric: She has a normal mood and affect. Her behavior is normal.          Assessment & Plan:  Allergic sinusitis/allergic rhinitis- I do not see any causes of  bacterial sinusitis today. Will hold on antibiotic. Started prednisone taper today. I advised patient to start either Zyrtec, Claritin or Allegra at bedtime. I did send over Singulair for her to start daily as well. Continue with dymista daily. I'm making a referral for an allergist. I do think there is some correlation with allergy and possible mold at work. If sinus pressure continues with other symptoms can consider adding abx.

## 2015-10-21 NOTE — Patient Instructions (Signed)
dymista singulair Zyrtec/clariting/allegra D

## 2015-12-20 DIAGNOSIS — J301 Allergic rhinitis due to pollen: Secondary | ICD-10-CM | POA: Diagnosis not present

## 2016-01-06 DIAGNOSIS — J342 Deviated nasal septum: Secondary | ICD-10-CM | POA: Diagnosis not present

## 2016-01-06 DIAGNOSIS — J310 Chronic rhinitis: Secondary | ICD-10-CM | POA: Diagnosis not present

## 2016-01-06 DIAGNOSIS — J309 Allergic rhinitis, unspecified: Secondary | ICD-10-CM | POA: Diagnosis not present

## 2016-01-06 DIAGNOSIS — J343 Hypertrophy of nasal turbinates: Secondary | ICD-10-CM | POA: Diagnosis not present

## 2016-01-06 MED FILL — AZELASTINE 0.1% (137 MCG) S: 0.1 | 30 days supply | Qty: 30 | Fill #0

## 2016-01-07 MED FILL — MONTELUKAST SOD 10 MG TAB: 10 | 30 days supply | Qty: 30 | Fill #1

## 2016-02-06 ENCOUNTER — Other Ambulatory Visit: Payer: Self-pay | Admitting: *Deleted

## 2016-02-06 MED ORDER — ESCITALOPRAM OXALATE 10 MG PO TABS: 10.0000 mg | ORAL_TABLET | Freq: Every day | ORAL | Status: DC

## 2016-02-06 MED FILL — ESCITALOPRAM 10 MG TABLET: 10 | 30 days supply | Qty: 30 | Fill #0

## 2016-02-14 MED FILL — MONTELUKAST SOD 10 MG TAB: 10 | 30 days supply | Qty: 30 | Fill #2

## 2016-03-24 MED FILL — ESCITALOPRAM 10 MG TABLET: 10 | 30 days supply | Qty: 30 | Fill #1

## 2016-04-07 ENCOUNTER — Encounter: Payer: Self-pay | Admitting: *Deleted

## 2016-04-07 ENCOUNTER — Emergency Department
Admission: EM | Admit: 2016-04-07 | Discharge: 2016-04-07 | Disposition: A | Discharge status: Home / Self Care | Payer: 59 | Source: Home / Self Care | Attending: Family Medicine | Admitting: Family Medicine

## 2016-04-07 DIAGNOSIS — H1089 Other conjunctivitis: Secondary | ICD-10-CM | POA: Diagnosis not present

## 2016-04-07 MED ORDER — KETOROLAC TROMETHAMINE 0.5 % OP SOLN: 1.0000 [drp] | Freq: Four times a day (QID) | OPHTHALMIC | Status: DC

## 2016-04-07 NOTE — Discharge Instructions (Signed)
May begin using drops in right eye if right eye irritation develops.

## 2016-04-07 NOTE — ED Notes (Signed)
Pt c/o LT eye yellow drainage, redness, itching and watering x 2 days. No contacts. Denies recent URI. Hx of seasonal allergies.

## 2016-04-07 NOTE — ED Provider Notes (Signed)
CSN: [id number]     Arrival date & time 04/07/16  1805 History   First MD Initiated Contact with Patient 04/07/16 1839     Chief Complaint  Patient presents with  . Eye Drainage      HPI Comments: Patient reports that she developed left eye redness, itching, and increased lacrimation two days ago.  No foreign body sensation.  No recent URI.  She does have a history of environmental allergies.  No symptoms in her right eye.  No changes in vision.  Patient is a 50 y.o. female presenting with eye problem. The history is provided by the patient.  Eye Problem Location:  L eye Severity:  Mild Onset quality:  Sudden Duration:  2 days Timing:  Constant Progression:  Unchanged Chronicity:  New Context: not burn, not chemical exposure, not contact lens problem, not direct trauma, not foreign body, not scratch and not smoke exposure   Relieved by:  None tried Worsened by:  Nothing tried Ineffective treatments:  None tried Associated symptoms: discharge, inflammation, itching, redness and tearing   Associated symptoms: no blurred vision, no crusting, no decreased vision, no double vision, no facial rash, no foreign body sensation, no headaches, no photophobia and no swelling   Risk factors: no recent URI     History reviewed. No pertinent past medical history. Past Surgical History  Procedure Laterality Date  . Abdominal hysterectomy     Family History  Problem Relation Age of Onset  . Heart attack Father 35    open heart at 58  . Hypertension Father    Social History  Substance Use Topics  . Smoking status: Never Smoker   . Smokeless tobacco: None  . Alcohol Use: No   OB History    No data available     Review of Systems  Eyes: Positive for discharge, redness and itching. Negative for blurred vision, double vision and photophobia.  Neurological: Negative for headaches.  All other systems reviewed and are negative.   Allergies  Review of patient's allergies indicates no  known allergies.  Home Medications   Prior to Admission medications   Medication Sig Start Date End Date Taking? Authorizing Provider  atorvastatin (LIPITOR) 40 MG tablet Take 1 tablet (40 mg total) by mouth daily. 10/18/15   Jade L Breeback, PA-C  Azelastine-Fluticasone 137-50 MCG/ACT SUSP 1 spray each nostril twice a day. Patient not taking: Reported on 10/21/2015 08/07/15   Jade L Breeback, PA-C  escitalopram (LEXAPRO) 10 MG tablet Take 1 tablet (10 mg total) by mouth daily. 02/06/16   Jade L Breeback, PA-C  fluticasone (FLONASE) 50 MCG/ACT nasal spray Place two sprays in each nostril once daily 07/22/15   Kandra Nicolas, MD  ketorolac (ACULAR) 0.5 % ophthalmic solution Place 1 drop into the left eye 4 (four) times daily. 04/07/16   Kandra Nicolas, MD  montelukast (SINGULAIR) 10 MG tablet Take 1 tablet (10 mg total) by mouth at bedtime. 10/21/15   Donella Stade, PA-C  Multiple Vitamin (MULTIVITAMIN) tablet Take 1 tablet by mouth daily.    Historical Provider, MD  predniSONE (DELTASONE) 20 MG tablet Take 3 tablets for 3 days, take 2 tablets for 3 days, take 1 tablet for 3 days, take 1/2 tablet for 4 days. 10/21/15   Donella Stade, PA-C   Meds Ordered and Administered this Visit  Medications - No data to display  BP 112/74 mmHg  Pulse 69  Temp(Src) 97.7 F (36.5 C) (Oral)  Resp 16  Ht 5' (1.524 m)  Wt 155 lb (70.308 kg)  BMI 30.27 kg/m2  SpO2 96% No data found.   Physical Exam  Constitutional: She is oriented to person, place, and time. She appears well-developed and well-nourished. No distress.  HENT:  Head: Atraumatic.  Right Ear: External ear normal.  Left Ear: External ear normal.  Mouth/Throat: Oropharynx is clear and moist.  Eyes: Conjunctivae and EOM are normal. Pupils are equal, round, and reactive to light. Right eye exhibits no discharge. Left eye exhibits no discharge.  Neck: Neck supple.  Slightly enlarged tender posterior/lateral nodes are palpated.   Cardiovascular: Normal heart sounds.   Pulmonary/Chest: Breath sounds normal.  Abdominal: There is no tenderness.  Lymphadenopathy:    She has cervical adenopathy.  Neurological: She is alert and oriented to person, place, and time.  Skin: Skin is warm and dry. No rash noted.  Nursing note and vitals reviewed.   ED Course  Procedures none   MDM   1. Other conjunctivitis; ?viral vs allergic    Begin Acular ophthalmic suspension. May begin using drops in right eye if right eye irritation develops. Followup with ophthalmologist if not improving 3 days.    Kandra Nicolas, MD 04/14/16 2037

## 2016-04-08 MED FILL — KETOROLAC 0.5% OPHTH SOLN: 0.5 | 25 days supply | Qty: 5 | Fill #0

## 2016-04-27 ENCOUNTER — Other Ambulatory Visit: Payer: Self-pay | Admitting: Physician Assistant

## 2016-05-07 MED FILL — ESCITALOPRAM 10 MG TABLET: 10 | 30 days supply | Qty: 30 | Fill #2

## 2016-05-08 DIAGNOSIS — L570 Actinic keratosis: Secondary | ICD-10-CM | POA: Diagnosis not present

## 2016-05-08 DIAGNOSIS — Z85828 Personal history of other malignant neoplasm of skin: Secondary | ICD-10-CM | POA: Diagnosis not present

## 2016-05-08 DIAGNOSIS — Z08 Encounter for follow-up examination after completed treatment for malignant neoplasm: Secondary | ICD-10-CM | POA: Diagnosis not present

## 2016-06-02 DIAGNOSIS — B351 Tinea unguium: Secondary | ICD-10-CM | POA: Insufficient documentation

## 2016-06-04 DIAGNOSIS — H9203 Otalgia, bilateral: Secondary | ICD-10-CM | POA: Diagnosis not present

## 2016-06-23 ENCOUNTER — Emergency Department
Admission: EM | Admit: 2016-06-23 | Discharge: 2016-06-23 | Disposition: A | Discharge status: Home / Self Care | Payer: 59 | Source: Home / Self Care | Attending: Family Medicine | Admitting: Family Medicine

## 2016-06-23 DIAGNOSIS — S39012A Strain of muscle, fascia and tendon of lower back, initial encounter: Secondary | ICD-10-CM | POA: Diagnosis not present

## 2016-06-23 MED ORDER — CYCLOBENZAPRINE HCL 10 MG PO TABS: 10.0000 mg | ORAL_TABLET | Freq: Three times a day (TID) | ORAL | Status: DC | PRN

## 2016-06-23 MED ORDER — PREDNISONE 20 MG PO TABS: ORAL_TABLET | ORAL | Status: DC

## 2016-06-23 MED ORDER — HYDROCODONE-ACETAMINOPHEN 5-325 MG PO TABS: ORAL_TABLET | ORAL | Status: DC

## 2016-06-23 NOTE — ED Notes (Signed)
Pt had virus Sunday night, N, V, D.  Back started hurting around 1 am Tuesday, took tylenol at 5 am.  At 6 she got in the shower, and bent over to wash her feet.  Had a sharp pain from hip up to the mid back.  Denies numbness and tingling in legs, but says legs feel weak like she is going to fall.  The pain is the greatest when she is lifting her right leg.

## 2016-06-23 NOTE — Discharge Instructions (Signed)
Apply ice pack for 20 to 30 minutes, 3 to 4 times daily  Continue until pain decreases.  Begin back range of motion and stretching exercises as tolerated.   Lumbosacral Strain Lumbosacral strain is a strain of any of the parts that make up your lumbosacral vertebrae. Your lumbosacral vertebrae are the bones that make up the lower third of your backbone. Your lumbosacral vertebrae are held together by muscles and tough, fibrous tissue (ligaments).  CAUSES  A sudden blow to your back can cause lumbosacral strain. Also, anything that causes an excessive stretch of the muscles in the low back can cause this strain. This is typically seen when people exert themselves strenuously, fall, lift heavy objects, bend, or crouch repeatedly. RISK FACTORS  Physically demanding work.  Participation in pushing or pulling sports or sports that require a sudden twist of the back (tennis, golf, baseball).  Weight lifting.  Excessive lower back curvature.  Forward-tilted pelvis.  Weak back or abdominal muscles or both.  Tight hamstrings. SIGNS AND SYMPTOMS  Lumbosacral strain may cause pain in the area of your injury or pain that moves (radiates) down your leg.  DIAGNOSIS Your health care provider can often diagnose lumbosacral strain through a physical exam. In some cases, you may need tests such as X-ray exams.  TREATMENT  Treatment for your lower back injury depends on many factors that your clinician will have to evaluate. However, most treatment will include the use of anti-inflammatory medicines. HOME CARE INSTRUCTIONS   Avoid hard physical activities (tennis, racquetball, waterskiing) if you are not in proper physical condition for it. This may aggravate or create problems.  If you have a back problem, avoid sports requiring sudden body movements. Swimming and walking are generally safer activities.  Maintain good posture.  Maintain a healthy weight.  For acute conditions, you may put ice on  the injured area.  Put ice in a plastic bag.  Place a towel between your skin and the bag.  Leave the ice on for 20 minutes, 2-3 times a day.  When the low back starts healing, stretching and strengthening exercises may be recommended. SEEK MEDICAL CARE IF:  Your back pain is getting worse.  You experience severe back pain not relieved with medicines. SEEK IMMEDIATE MEDICAL CARE IF:   You have numbness, tingling, weakness, or problems with the use of your arms or legs.  There is a change in bowel or bladder control.  You have increasing pain in any area of the body, including your belly (abdomen).  You notice shortness of breath, dizziness, or feel faint.  You feel sick to your stomach (nauseous), are throwing up (vomiting), or become sweaty.  You notice discoloration of your toes or legs, or your feet get very cold. MAKE SURE YOU:   Understand these instructions.  Will watch your condition.  Will get help right away if you are not doing well or get worse.   This information is not intended to replace advice given to you by your health care provider. Make sure you discuss any questions you have with your health care provider.   Document Released: 09/02/2005 Document Revised: 12/14/2014 Document Reviewed: 07/12/2013 Elsevier Interactive Patient Education [employer].

## 2016-06-23 NOTE — ED Provider Notes (Signed)
CSN: [id number]     Arrival date & time 06/23/16  1942 History   None    Chief Complaint  Patient presents with  . Back Pain      HPI Comments: Patient reports that she had a "stomach virus" two days ago that resolved.  Last night she noticed vague mild lower back ache.  This morning while in the shower, she bent over to wash her feet and suddenly developed a sharp pain in her right lower back/buttock.  The pain does not radiate.   She denies bowel or bladder dysfunction, and no saddle numbness.  The pain is worse when she walks, and lifts her right leg.    Patient is a 50 y.o. female presenting with back pain. The history is provided by the patient and the spouse.  Back Pain Location:  Gluteal region Quality:  Stabbing Radiates to:  Does not radiate Pain severity:  Moderate Pain is:  Same all the time Onset quality:  Sudden Duration:  14 hours Timing:  Constant Progression:  Unchanged Chronicity:  New Context comment:  Simply bending over in shower Relieved by:  Nothing Worsened by:  Standing (lifting right leg) Ineffective treatments:  None tried Associated symptoms: no abdominal pain, no abdominal swelling, no bladder incontinence, no bowel incontinence, no chest pain, no dysuria, no fever, no leg pain, no numbness, no paresthesias, no pelvic pain, no perianal numbness, no tingling, no weakness and no weight loss     History reviewed. No pertinent past medical history. Past Surgical History  Procedure Laterality Date  . Abdominal hysterectomy     Family History  Problem Relation Age of Onset  . Heart attack Father 4    open heart at 20  . Hypertension Father    Social History  Substance Use Topics  . Smoking status: Never Smoker   . Smokeless tobacco: None  . Alcohol Use: No   OB History    No data available     Review of Systems  Constitutional: Negative for fever and weight loss.  Cardiovascular: Negative for chest pain.  Gastrointestinal: Negative for  abdominal pain and bowel incontinence.  Genitourinary: Negative for bladder incontinence, dysuria and pelvic pain.  Musculoskeletal: Positive for back pain.  Neurological: Negative for tingling, weakness, numbness and paresthesias.  All other systems reviewed and are negative.   Allergies  Review of patient's allergies indicates no known allergies.  Home Medications   Prior to Admission medications   Medication Sig Start Date End Date Taking? Authorizing Provider  atorvastatin (LIPITOR) 40 MG tablet Take 1 tablet (40 mg total) by mouth daily. 10/18/15   Jade L Breeback, PA-C  Azelastine-Fluticasone 137-50 MCG/ACT SUSP 1 spray each nostril twice a day. Patient not taking: Reported on 10/21/2015 08/07/15   Donella Stade, PA-C  cyclobenzaprine (FLEXERIL) 10 MG tablet Take 1 tablet (10 mg total) by mouth 3 (three) times daily as needed for muscle spasms. 06/23/16   Kandra Nicolas, MD  escitalopram (LEXAPRO) 10 MG tablet Take 1 tablet (10 mg total) by mouth daily. 02/06/16   Donella Stade, PA-C  fluticasone (FLONASE) 50 MCG/ACT nasal spray Place two sprays in each nostril once daily 07/22/15   Kandra Nicolas, MD  HYDROcodone-acetaminophen (NORCO/VICODIN) 5-325 MG tablet Take one by mouth at bedtime as needed for pain 06/23/16   Kandra Nicolas, MD  ketorolac (ACULAR) 0.5 % ophthalmic solution Place 1 drop into the left eye 4 (four) times daily. 04/07/16   Kandra Nicolas,  MD  montelukast (SINGULAIR) 10 MG tablet Take 1 tablet (10 mg total) by mouth at bedtime. 10/21/15   Donella Stade, PA-C  Multiple Vitamin (MULTIVITAMIN) tablet Take 1 tablet by mouth daily.    Historical Provider, MD  predniSONE (DELTASONE) 20 MG tablet Take one tab by mouth twice daily for 5 days, then one daily. Take with food. 06/23/16   Kandra Nicolas, MD   Meds Ordered and Administered this Visit  Medications - No data to display  BP 118/82 mmHg  Pulse 74  Temp(Src) 98.4 F (36.9 C) (Oral)  Ht 5\' 1"  (1.549 m)  Wt  146 lb (66.225 kg)  BMI 27.60 kg/m2  SpO2 98% No data found.   Physical Exam  Constitutional: She is oriented to person, place, and time. She appears well-developed and well-nourished. No distress.  HENT:  Head: Atraumatic.  Nose: Nose normal.  Mouth/Throat: Oropharynx is clear and moist.  Eyes: Conjunctivae are normal. Pupils are equal, round, and reactive to light.  Neck: Neck supple.  Cardiovascular: Normal heart sounds and intact distal pulses.   Pulmonary/Chest: Breath sounds normal.  Abdominal: There is no tenderness.  Musculoskeletal: She exhibits no edema.       Lumbar back: She exhibits decreased range of motion. She exhibits no tenderness, no bony tenderness and no swelling.       Back:  Area of pain outlined in blue on diagram, but no tenderness to palpation at this location.  Back:  Decreased range of motion. Straight leg raising test is negative.  Sitting knee extension test is negative.  Strength and sensation in the lower extremities is normal.  Patellar and achilles reflexes are normal   Lymphadenopathy:    She has no cervical adenopathy.  Neurological: She is alert and oriented to person, place, and time. She has normal reflexes.  Skin: Skin is warm and dry. No rash noted.  Nursing note and vitals reviewed.   ED Course  Procedures none    MDM   1. Low back strain, initial encounter    With a history of GERD, will begin prednisone burst/taper rather than NSAID.  Rx for Flexeril.  Lortab for pain at bedtime. Apply ice pack for 20 to 30 minutes, 3 to 4 times daily  Continue until pain decreases.  Begin back range of motion and stretching exercises as tolerated. Followup with Dr. Aundria Mems or Dr. Lynne Leader ([facility]) if not improving about two weeks.     Kandra Nicolas, MD 06/26/16 1011

## 2016-07-13 ENCOUNTER — Other Ambulatory Visit: Payer: Self-pay | Admitting: Physician Assistant

## 2016-07-13 MED ORDER — ESCITALOPRAM OXALATE 10 MG PO TABS: 10.0000 mg | ORAL_TABLET | Freq: Every day | ORAL | 0 refills | Status: DC

## 2016-07-13 MED FILL — ESCITALOPRAM 10 MG TABLET: 10 | 30 days supply | Qty: 30 | Fill #0

## 2016-08-05 ENCOUNTER — Encounter: Payer: Self-pay | Admitting: Physician Assistant

## 2016-08-05 ENCOUNTER — Ambulatory Visit (INDEPENDENT_AMBULATORY_CARE_PROVIDER_SITE_OTHER): Payer: 59 | Admitting: Physician Assistant

## 2016-08-05 VITALS — BP 130/64 | HR 77 | Ht 61.0 in | Wt 150.0 lb

## 2016-08-05 DIAGNOSIS — N959 Unspecified menopausal and perimenopausal disorder: Secondary | ICD-10-CM

## 2016-08-05 DIAGNOSIS — E785 Hyperlipidemia, unspecified: Secondary | ICD-10-CM | POA: Diagnosis not present

## 2016-08-05 DIAGNOSIS — R0981 Nasal congestion: Secondary | ICD-10-CM | POA: Diagnosis not present

## 2016-08-05 MED ORDER — ESCITALOPRAM OXALATE 10 MG PO TABS
10.0000 mg | ORAL_TABLET | Freq: Every day | ORAL | 1 refills | Status: DC
Start: 2016-08-05 — End: 2017-04-14

## 2016-08-05 MED ORDER — ATORVASTATIN CALCIUM 40 MG PO TABS: 40.0000 mg | ORAL_TABLET | Freq: Every day | ORAL | 1 refills | Status: DC

## 2016-08-05 NOTE — Patient Instructions (Signed)
ASA 81mg  daily.  Start lipitor.  Continue lexapro.

## 2016-08-05 NOTE — Progress Notes (Signed)
   Subjective:    Patient ID: Donna Ford, female    DOB: 08-29-1966, 50 y.o.   MRN: [id number]  HPI Patient is a 50 year old female who presents to the clinic for follow-up. It has been over a year since the last follow-up. She had extremely high LDL over year ago. Her LDL was 197. She admits she never started Lipitor. She was overwhelmed with everything going on with her nasal congestion and allergic rhinitis that she did not want to start any new medications. She is taking a baby aspirin a day. She was started on Lexapro for her postmenopausal mood changes and hot flashes. That has significantly helped her. She was out of it for about a month and she noticed that her mood and hot flashes increase. She would like to stay on that today.   Review of Systems See history of present illness    Objective:   Physical Exam  Constitutional: She is oriented to person, place, and time. She appears well-developed and well-nourished.  HENT:  Head: Normocephalic and atraumatic.  Neck: Normal range of motion. Neck supple.  Cardiovascular: Normal rate, regular rhythm and normal heart sounds.   Pulmonary/Chest: Effort normal and breath sounds normal.  Lymphadenopathy:    She has no cervical adenopathy.  Neurological: She is alert and oriented to person, place, and time.  Psychiatric: She has a normal mood and affect. Her behavior is normal.          Assessment & Plan:  Post menopausal symptoms-refilled Lexapro for 6 months. I would like for patient to return to clinic in 6 months for overall recheck.  Hyperlipidemia-please start Lipitor. Discussed with an LDL that high at the cardiac risk that puts her in risk of. Continue with baby aspirin daily. We'll recheck cholesterol in 6 months. I recommended a complete physical in 6 months.  Patient declined flu shot today.

## 2016-08-14 MED FILL — ATORVASTATIN 40 MG TABLET: 40 | 90 days supply | Qty: 90 | Fill #0

## 2016-08-14 MED FILL — ESCITALOPRAM 10 MG TABLET: 10 | 90 days supply | Qty: 90 | Fill #0

## 2016-09-08 ENCOUNTER — Other Ambulatory Visit: Payer: Self-pay | Admitting: Physician Assistant

## 2016-09-08 DIAGNOSIS — Z1231 Encounter for screening mammogram for malignant neoplasm of breast: Secondary | ICD-10-CM

## 2016-10-02 ENCOUNTER — Encounter: Payer: Self-pay | Admitting: Physician Assistant

## 2016-10-08 DIAGNOSIS — H5203 Hypermetropia, bilateral: Secondary | ICD-10-CM | POA: Diagnosis not present

## 2016-10-08 DIAGNOSIS — H524 Presbyopia: Secondary | ICD-10-CM | POA: Diagnosis not present

## 2016-10-14 ENCOUNTER — Ambulatory Visit (INDEPENDENT_AMBULATORY_CARE_PROVIDER_SITE_OTHER): Payer: 59

## 2016-10-14 DIAGNOSIS — Z1231 Encounter for screening mammogram for malignant neoplasm of breast: Secondary | ICD-10-CM | POA: Diagnosis not present

## 2016-11-24 ENCOUNTER — Ambulatory Visit (INDEPENDENT_AMBULATORY_CARE_PROVIDER_SITE_OTHER): Payer: 59 | Admitting: Family Medicine

## 2016-11-24 ENCOUNTER — Encounter: Payer: Self-pay | Admitting: Family Medicine

## 2016-11-24 VITALS — BP 118/68 | HR 80 | Temp 98.1°F | Wt 153.0 lb

## 2016-11-24 DIAGNOSIS — R51 Headache: Secondary | ICD-10-CM

## 2016-11-24 DIAGNOSIS — R519 Headache, unspecified: Secondary | ICD-10-CM

## 2016-11-24 LAB — POCT INFLUENZA A/B
Influenza A, POC: NEGATIVE
Influenza B, POC: NEGATIVE

## 2016-11-24 NOTE — Progress Notes (Signed)
       Donna Ford is a 50 y.o. female who presents to [facility]: [place] today for headache and body aches and fatigue. Symptoms present for 2 days. No vomiting diarrhea chest pain palpitations or shortness of breath. Patient has tried some ibuprofen and Tylenol which have helped a bit. She feels well otherwise. She received a flu vaccine earlier this year.   No past medical history on file. Past Surgical History:  Procedure Laterality Date  . ABDOMINAL HYSTERECTOMY     Social History  Substance Use Topics  . Smoking status: Never Smoker  . Smokeless tobacco: Not on file  . Alcohol use No   family history includes Heart attack (age of onset: 53) in her father; Hypertension in her father.  ROS as above:  Medications: Current Outpatient Prescriptions  Medication Sig Dispense Refill  . atorvastatin (LIPITOR) 40 MG tablet Take 1 tablet (40 mg total) by mouth daily. 90 tablet 1  . escitalopram (LEXAPRO) 10 MG tablet Take 1 tablet (10 mg total) by mouth daily. 90 tablet 1  . Multiple Vitamin (MULTIVITAMIN) tablet Take 1 tablet by mouth daily.     No current facility-administered medications for this visit.    No Known Allergies  Health Maintenance Health Maintenance  Topic Date Due  . INFLUENZA VACCINE  08/05/2017 (Originally 07/07/2016)  . MAMMOGRAM  10/14/2018  . TETANUS/TDAP  12/08/2023  . COLONOSCOPY  05/14/2025  . Hepatitis C Screening  Completed  . HIV Screening  Completed     Exam:  BP 118/68   Pulse 80   Temp 98.1 F (36.7 C) (Oral)   Wt 153 lb (69.4 kg)   SpO2 96%   BMI 28.91 kg/m  Gen: Well NAD Nontoxic appearing HEENT: EOMI,  MMM posterior pharynx with mild cobblestoning. Normal tympanic membranes bilaterally. Mild cervical lymphadenopathy present bilaterally. No meningismus Lungs: Normal work of breathing. CTABL Heart: RRR no MRG Abd: NABS, Soft.  Nondistended, Nontender Exts: Brisk capillary refill, warm and well perfused.     Results for orders placed or performed in visit on 11/24/16 (from the past 72 hour(s))  POCT Influenza A/B     Status: Normal   Collection Time: 11/24/16  2:39 PM  Result Value Ref Range   Influenza A, POC Negative Negative   Influenza B, POC Negative Negative   No results found.    Assessment and Plan: 50 y.o. female with Viral illness with headaches and bodyaches. Flu test negative. Plan for watchful waiting with over-the-counter medications as needed for symptom control. Return as needed.   Orders Placed This Encounter  Procedures  . POCT Influenza A/B    Discussed warning signs or symptoms. Please see discharge instructions. Patient expresses understanding.

## 2016-11-24 NOTE — Patient Instructions (Signed)
Thank you for coming in today. Continue over the counter medicine as needed for symptoms.  Return as needed.  You should start to feel better in a a few days.  You can take up to 2 aleve twice daily or 2 extra strength tylenol every 6 hours for pain.    General Headache Without Cause Introduction A headache is pain or discomfort felt around the head or neck area. There are many causes and types of headaches. In some cases, the cause may not be found. Follow these instructions at home: Managing pain  Take over-the-counter and prescription medicines only as told by your doctor.  Lie down in a dark, quiet room when you have a headache.  If directed, apply ice to the head and neck area:  Put ice in a plastic bag.  Place a towel between your skin and the bag.  Leave the ice on for 20 minutes, 2-3 times per day.  Use a heating pad or hot shower to apply heat to the head and neck area as told by your doctor.  Keep lights dim if bright lights bother you or make your headaches worse. Eating and drinking  Eat meals on a regular schedule.  Lessen how much alcohol you drink.  Lessen how much caffeine you drink, or stop drinking caffeine. General instructions  Keep all follow-up visits as told by your doctor. This is important.  Keep a journal to find out if certain things bring on headaches. For example, write down:  What you eat and drink.  How much sleep you get.  Any change to your diet or medicines.  Relax by getting a massage or doing other relaxing activities.  Lessen stress.  Sit up straight. Do not tighten (tense) your muscles.  Do not use tobacco products. This includes cigarettes, chewing tobacco, or e-cigarettes. If you need help quitting, ask your doctor.  Exercise regularly as told by your doctor.  Get enough sleep. This often means 7-9 hours of sleep. Contact a doctor if:  Your symptoms are not helped by medicine.  You have a headache that feels  different than the other headaches.  You feel sick to your stomach (nauseous) or you throw up (vomit).  You have a fever. Get help right away if:  Your headache becomes really bad.  You keep throwing up.  You have a stiff neck.  You have trouble seeing.  You have trouble speaking.  You have pain in the eye or ear.  Your muscles are weak or you lose muscle control.  You lose your balance or have trouble walking.  You feel like you will pass out (faint) or you pass out.  You have confusion. This information is not intended to replace advice given to you by your health care provider. Make sure you discuss any questions you have with your health care provider. Document Released: 09/01/2008 Document Revised: 04/30/2016 Document Reviewed: 03/18/2015  2017 Elsevier

## 2016-11-25 MED FILL — ESCITALOPRAM 10 MG TABLET: 10 | 90 days supply | Qty: 90 | Fill #1

## 2017-01-14 DIAGNOSIS — D045 Carcinoma in situ of skin of trunk: Secondary | ICD-10-CM | POA: Diagnosis not present

## 2017-04-14 ENCOUNTER — Other Ambulatory Visit: Payer: Self-pay | Admitting: Physician Assistant

## 2017-04-15 DIAGNOSIS — L570 Actinic keratosis: Secondary | ICD-10-CM | POA: Diagnosis not present

## 2017-04-15 MED FILL — ESCITALOPRAM 10 MG TABLET: 10 | 15 days supply | Qty: 15 | Fill #0

## 2017-05-12 ENCOUNTER — Ambulatory Visit (INDEPENDENT_AMBULATORY_CARE_PROVIDER_SITE_OTHER): Payer: 59 | Admitting: Physician Assistant

## 2017-05-12 ENCOUNTER — Encounter: Payer: Self-pay | Admitting: Physician Assistant

## 2017-05-12 VITALS — BP 121/70 | HR 76 | Ht 61.0 in | Wt 149.0 lb

## 2017-05-12 DIAGNOSIS — E785 Hyperlipidemia, unspecified: Secondary | ICD-10-CM | POA: Diagnosis not present

## 2017-05-12 DIAGNOSIS — M6530 Trigger finger, unspecified finger: Secondary | ICD-10-CM

## 2017-05-12 DIAGNOSIS — Z79899 Other long term (current) drug therapy: Secondary | ICD-10-CM

## 2017-05-12 DIAGNOSIS — IMO0002 Reserved for concepts with insufficient information to code with codable children: Secondary | ICD-10-CM | POA: Insufficient documentation

## 2017-05-12 DIAGNOSIS — F3340 Major depressive disorder, recurrent, in remission, unspecified: Secondary | ICD-10-CM

## 2017-05-12 MED ORDER — IBUPROFEN 800 MG PO TABS: 800.0000 mg | ORAL_TABLET | Freq: Three times a day (TID) | ORAL | 1 refills | Status: DC | PRN

## 2017-05-12 MED ORDER — ESCITALOPRAM OXALATE 10 MG PO TABS: 10.0000 mg | ORAL_TABLET | Freq: Every day | ORAL | 3 refills | Status: DC

## 2017-05-12 MED FILL — IBUPROFEN 800 MG TAB: 800 | 30 days supply | Qty: 90 | Fill #0

## 2017-05-12 MED FILL — ESCITALOPRAM 10 MG TABLET: 10 | 90 days supply | Qty: 90 | Fill #0

## 2017-05-12 NOTE — Progress Notes (Signed)
   Subjective:    Patient ID: Donna Ford, female    DOB: [birth year], 51 y.o.   MRN: [id number]  HPI Pt is a 51 yo female who presents to the clinic for medication refill.   MDD- doing great on lexapro daily. No problems, concerns, or side effects.   Dyslipidemia- she has elevated numbers but could not tolerate lipitor. She is working on [employer] and exercise. Needs rechecked.   She has a painful catching of left middle finger for the month. She is left handed so it is effecting her ability to do things. She has not tried anything to make better. She does not remember any trauma.   .. Active Ambulatory Problems    Diagnosis Date Noted  . Esophageal reflux 10/19/2014  . External hemorrhoid 10/19/2014  . Rectal pain 10/22/2014  . Rhinitis, allergic 08/07/2015  . Post menopausal problems 08/07/2015  . Nasal congestion 08/07/2015  . Hyperlipemia 10/10/2015  . Recurrent major depressive disorder, in remission ([place]) 05/12/2017  . Trigger finger of left hand 05/12/2017   Resolved Ambulatory Problems    Diagnosis Date Noted  . No Resolved Ambulatory Problems   No Additional Past Medical History      Review of Systems  All other systems reviewed and are negative.      Objective:   Physical Exam  Constitutional: She is oriented to person, place, and time. She appears well-developed and well-nourished.  HENT:  Head: Normocephalic and atraumatic.  Cardiovascular: Normal rate, regular rhythm and normal heart sounds.   Pulmonary/Chest: Effort normal and breath sounds normal.  Musculoskeletal:  Tenderness and nodule at base of 3rd metacarpal with catching during extension.   Neurological: She is alert and oriented to person, place, and time.  Psychiatric: She has a normal mood and affect. Her behavior is normal.          Assessment & Plan:  Marland KitchenMarland KitchenDiagnoses and all orders for this visit:  Recurrent major depressive disorder, in remission ([place]) -     escitalopram (LEXAPRO)  10 MG tablet; Take 1 tablet (10 mg total) by mouth daily.  Dyslipidemia (high LDL; low HDL) -     Lipid Panel w/reflex Direct LDL  Trigger finger of left hand, unspecified finger -     ibuprofen (ADVIL,MOTRIN) 800 MG tablet; Take 1 tablet (800 mg total) by mouth every 8 (eight) hours as needed.  Medication management -     COMPLETE METABOLIC PANEL WITH GFR   .Marland Kitchen Depression screen [facility] 2/9 05/12/2017  Decreased Interest 0  Down, Depressed, Hopeless 0  PHQ - 2 Score 0  Altered sleeping 0  Tired, decreased energy 0  Change in appetite 0  Feeling bad or failure about yourself  0  Trouble concentrating 0  Moving slowly or fidgety/restless 0  Suicidal thoughts 0  PHQ-9 Score 0   .. GAD 7 : Generalized Anxiety Score 05/12/2017  Nervous, Anxious, on Edge 0  Control/stop worrying 0  Worry too much - different things 0  Trouble relaxing 0  Restless 0  Easily annoyed or irritable 0  Afraid - awful might happen 0  Total GAD 7 Score 0  Anxiety Difficulty Not difficult at all    Refilled lexapro for 1 year. cmp ordered.   Lipid order to evaluate cholesterol.   Buddy tapped left middle finger in resting position. Ibuprofen 800mg  given to take for next 5 days regularly. HO given. Follow up with sports medicine to consider injection.

## 2017-05-12 NOTE — Patient Instructions (Signed)
Trigger Finger Trigger finger (stenosing tenosynovitis) is a condition that causes a finger to get stuck in a bent position. Each finger has a tough, cord-like tissue that connects muscle to bone (tendon), and each tendon is surrounded by a tunnel of tissue (tendon sheath). To move your finger, your tendon needs to slide freely through the sheath. Trigger finger happens when the tendon or the sheath thickens, making it difficult to move your finger. Trigger finger can affect any finger or a thumb. It may affect more than one finger. Mild cases may clear up with rest and medicine. Severe cases require more treatment. What are the causes? Trigger finger is caused by a thickened finger tendon or tendon sheath. The cause of this thickening is not known. What increases the risk? The following factors may make you more likely to develop this condition:  Doing activities that require a strong grip.  Having rheumatoid arthritis, gout, or diabetes.  Being 40-60 years old.  Being a woman.  What are the signs or symptoms? Symptoms of this condition include:  Pain when bending or straightening your finger.  Tenderness or swelling where your finger attaches to the palm of your hand.  A lump in the palm of your hand or on the inside of your finger.  Hearing a popping sound when you try to straighten your finger.  Feeling a popping, catching, or locking sensation when you try to straighten your finger.  Being unable to straighten your finger.  How is this diagnosed? This condition is diagnosed based on your symptoms and a physical exam. How is this treated? This condition may be treated by:  Resting your finger and avoiding activities that make symptoms worse.  Wearing a finger splint to keep your finger in a slightly bent position.  Taking NSAIDs to relieve pain and swelling.  Injecting medicine (steroids) into the tendon sheath to reduce swelling and irritation. Injections may need to be  repeated.  Having surgery to open the tendon sheath. This may be done if other treatments do not work and you cannot straighten your finger. You may need physical therapy after surgery.  Follow these instructions at home:  Use moist heat to help reduce pain and swelling as told by your health care provider.  Rest your finger and avoid activities that make pain worse. Return to normal activities as told by your health care provider.  If you have a splint, wear it as told by your health care provider.  Take over-the-counter and prescription medicines only as told by your health care provider.  Keep all follow-up visits as told by your health care provider. This is important. Contact a health care provider if:  Your symptoms are not improving with home care. Summary  Trigger finger (stenosing tenosynovitis) causes your finger to get stuck in a bent position, and it can make it difficult and painful to straighten your finger.  This condition develops when a finger tendon or tendon sheath thickens.  Treatment starts with resting, wearing a splint, and taking NSAIDs.  In severe cases, surgery to open the tendon sheath may be needed. This information is not intended to replace advice given to you by your health care provider. Make sure you discuss any questions you have with your health care provider. Document Released: 09/12/2004 Document Revised: 11/03/2016 Document Reviewed: 11/03/2016 Elsevier Interactive Patient Education  2017 Elsevier Inc.  

## 2017-07-20 DIAGNOSIS — Z79899 Other long term (current) drug therapy: Secondary | ICD-10-CM | POA: Diagnosis not present

## 2017-07-20 DIAGNOSIS — E785 Hyperlipidemia, unspecified: Secondary | ICD-10-CM | POA: Diagnosis not present

## 2017-07-21 LAB — COMPLETE METABOLIC PANEL WITH GFR
ALBUMIN: 4.1 g/dL (ref 3.6–5.1)
ALK PHOS: 74 U/L (ref 33–130)
ALT: 10 U/L (ref 6–29)
AST: 14 U/L (ref 10–35)
BILIRUBIN TOTAL: 0.5 mg/dL (ref 0.2–1.2)
BUN: 14 mg/dL (ref 7–25)
CO2: 26 mmol/L (ref 20–32)
CREATININE: 0.76 mg/dL (ref 0.50–1.05)
Calcium: 9.1 mg/dL (ref 8.6–10.4)
Chloride: 104 mmol/L (ref 98–110)
GFR, Est Non African American: >89 mL/min (ref >=60)
GLUCOSE: 89 mg/dL (ref 65–99)
Potassium: 4.3 mmol/L (ref 3.5–5.3)
SODIUM: 140 mmol/L (ref 135–146)
TOTAL PROTEIN: 6.4 g/dL (ref 6.1–8.1)

## 2017-07-21 LAB — LIPID PANEL W/REFLEX DIRECT LDL
CHOL/HDL RATIO: 5.8 ratio — AB (ref <5.0)
Cholesterol: 273 mg/dL — ABNORMAL HIGH (ref <200)
HDL: 47 mg/dL — ABNORMAL LOW (ref >50)
LDL-CHOLESTEROL: 193 mg/dL — AB
NON-HDL CHOLESTEROL (CALC): 226 mg/dL — AB (ref <130)
Triglycerides: 160 mg/dL — ABNORMAL HIGH (ref <150)

## 2017-07-23 NOTE — Progress Notes (Signed)
Call pt: cholesterol is very elevated. LDL is 193. HDL is low. You need to be on cholesterol medication. We could try one more statin and start every other day? Or try to get liavlo approved which is much better tolerated but branded drug.

## 2017-08-17 MED FILL — ESCITALOPRAM 10 MG TABLET: 10 | 90 days supply | Qty: 90 | Fill #1

## 2017-09-23 ENCOUNTER — Other Ambulatory Visit: Payer: Self-pay | Admitting: Physician Assistant

## 2017-09-23 DIAGNOSIS — Z1231 Encounter for screening mammogram for malignant neoplasm of breast: Secondary | ICD-10-CM

## 2017-10-12 DIAGNOSIS — L570 Actinic keratosis: Secondary | ICD-10-CM | POA: Diagnosis not present

## 2017-10-12 DIAGNOSIS — Z08 Encounter for follow-up examination after completed treatment for malignant neoplasm: Secondary | ICD-10-CM | POA: Diagnosis not present

## 2017-10-12 DIAGNOSIS — Z85828 Personal history of other malignant neoplasm of skin: Secondary | ICD-10-CM | POA: Diagnosis not present

## 2017-10-20 DIAGNOSIS — H52222 Regular astigmatism, left eye: Secondary | ICD-10-CM | POA: Diagnosis not present

## 2017-10-20 DIAGNOSIS — H5201 Hypermetropia, right eye: Secondary | ICD-10-CM | POA: Diagnosis not present

## 2017-10-20 DIAGNOSIS — H524 Presbyopia: Secondary | ICD-10-CM | POA: Diagnosis not present

## 2017-10-20 DIAGNOSIS — H5202 Hypermetropia, left eye: Secondary | ICD-10-CM | POA: Diagnosis not present

## 2017-10-22 ENCOUNTER — Ambulatory Visit (INDEPENDENT_AMBULATORY_CARE_PROVIDER_SITE_OTHER): Payer: 59 | Admitting: Sports Medicine

## 2017-10-22 ENCOUNTER — Encounter: Payer: Self-pay | Admitting: Sports Medicine

## 2017-10-22 ENCOUNTER — Ambulatory Visit (INDEPENDENT_AMBULATORY_CARE_PROVIDER_SITE_OTHER): Payer: 59

## 2017-10-22 DIAGNOSIS — M6530 Trigger finger, unspecified finger: Secondary | ICD-10-CM | POA: Diagnosis not present

## 2017-10-22 DIAGNOSIS — Z1231 Encounter for screening mammogram for malignant neoplasm of breast: Secondary | ICD-10-CM | POA: Diagnosis not present

## 2017-10-22 DIAGNOSIS — IMO0002 Reserved for concepts with insufficient information to code with codable children: Secondary | ICD-10-CM

## 2017-10-22 MED ORDER — MELOXICAM 15 MG PO TABS: ORAL_TABLET | ORAL | 3 refills | Status: DC

## 2017-10-22 MED FILL — MELOXICAM 15 MG TABLET: 15 | 30 days supply | Qty: 30 | Fill #0

## 2017-10-22 NOTE — Progress Notes (Signed)
   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  She tried some ibuprofen which has been beneficial, she is referred to me for further evaluation and definitive treatment.  Symptoms are moderate, persistent.  Past medical history, Surgical history, Family history not pertinant except as noted below, Social history, Allergies, and medications have been entered into the medical record, reviewed, and no changes needed.   Review of Systems: No headache, visual changes, nausea, vomiting, diarrhea, constipation, dizziness, abdominal pain, skin rash, fevers, chills, night sweats, weight loss, swollen lymph nodes, body aches, joint swelling, muscle aches, chest pain, shortness of breath, mood changes, visual or auditory hallucinations.   Objective:   General: Well Developed, well nourished, and in no acute distress.  Neuro:  Extra-ocular muscles intact, able to move all 4 extremities, sensation grossly intact.  Deep tendon reflexes tested were normal. Psych: Alert and oriented, mood congruent with affect. ENT:  Ears and nose appear unremarkable.  Hearing grossly normal. Neck: Unremarkable overall appearance, trachea midline.  No visible thyroid enlargement. Eyes: Conjunctivae and lids appear unremarkable.  Pupils equal and round. Skin: Warm and dry, no rashes noted.  Cardiovascular: Pulses palpable, no extremity edema. Hands: Unremarkable to inspection, minimal tenderness somewhat proximal to the volar third MCP over the A1 pulley, palpable flexor tendon nodule in association with the third digit, with palpable and visible triggering.  Neurovascularly intact distally.  Impression and Recommendations:   This case required medical  decision making of moderate complexity.  Trigger finger of both hands Bilateral third trigger fingers. We are going to start conservative with meloxicam, discontinue ibuprofen, referral for hand therapy.  ___________________________________________ Gwen Her. Dianah Field, M.D., ABFM., CAQSM. Primary Care and [place] Instructor of [place] [facility]

## 2017-10-22 NOTE — Assessment & Plan Note (Signed)
Bilateral third trigger fingers. We are going to start conservative with meloxicam, discontinue ibuprofen, referral for hand therapy.

## 2017-10-25 NOTE — Progress Notes (Signed)
Call pt: normal mammogram. Follow up in 1 year.

## 2017-11-26 MED FILL — ESCITALOPRAM 10 MG TABLET: 10 | 90 days supply | Qty: 90 | Fill #2

## 2018-03-15 MED FILL — ESCITALOPRAM 10 MG TABLET: 10 | 90 days supply | Qty: 90 | Fill #3 | Status: TO

## 2018-04-06 ENCOUNTER — Ambulatory Visit (INDEPENDENT_AMBULATORY_CARE_PROVIDER_SITE_OTHER): Payer: 59 | Admitting: Physician Assistant

## 2018-04-06 ENCOUNTER — Encounter: Payer: Self-pay | Admitting: Physician Assistant

## 2018-04-06 VITALS — BP 133/68 | HR 90 | Ht 61.0 in | Wt 143.0 lb

## 2018-04-06 DIAGNOSIS — R05 Cough: Secondary | ICD-10-CM

## 2018-04-06 DIAGNOSIS — R059 Cough, unspecified: Secondary | ICD-10-CM

## 2018-04-06 DIAGNOSIS — R0602 Shortness of breath: Secondary | ICD-10-CM

## 2018-04-06 MED ORDER — ALBUTEROL SULFATE HFA 108 (90 BASE) MCG/ACT IN AERS: 2.0000 | INHALATION_SPRAY | Freq: Four times a day (QID) | RESPIRATORY_TRACT | 2 refills | Status: DC | PRN

## 2018-04-06 MED ORDER — MONTELUKAST SODIUM 10 MG PO TABS: 10.0000 mg | ORAL_TABLET | Freq: Every day | ORAL | 4 refills | Status: DC

## 2018-04-06 NOTE — Patient Instructions (Signed)
Albuterol for as needed.  spironmetry in 1 week.  CXR today.

## 2018-04-06 NOTE — Progress Notes (Signed)
Subjective:    Patient ID: Donna Ford, female    DOB: 1966-03-12, 52 y.o.   MRN: [id number]  HPI 52 year old pleasant appearing female comes in today to discuss ongoing sinus issues. Back in 2016, she was seen by ENT for allergy testing and diagnosed with allergies to multiple allergens. In February of this year she was seen via a virtual visit for sinus congestion and cough and was subsequently prescribed prednisone and an antibiotic. Approximately 2 weeks ago while on a cross [place] RV trip she developed a cough, nasal congestion, shortness of breath, and had a hard time catching her breath from coughing so hard several times. She was seen for a virtual visit once again and prescribed Singulair and prednisone which greatly alleviated her symptoms. She denies having any symptoms currently.    Review of Systems  All other systems reviewed and are negative.  .. Active Ambulatory Problems    Diagnosis Date Noted  . Esophageal reflux 10/19/2014  . External hemorrhoid 10/19/2014  . Rectal pain 10/22/2014  . Rhinitis, allergic 08/07/2015  . Post menopausal problems 08/07/2015  . Nasal congestion 08/07/2015  . Hyperlipemia 10/10/2015  . Recurrent major depressive disorder, in remission ([place]) 05/12/2017  . Trigger finger of both hands 05/12/2017  . Cough 04/07/2018  . SOB (shortness of breath) 04/07/2018   Resolved Ambulatory Problems    Diagnosis Date Noted  . No Resolved Ambulatory Problems   No Additional Past Medical History       Objective:   Physical Exam  Constitutional: She is oriented to person, place, and time. She appears well-developed and well-nourished.  HENT:  Head: Normocephalic and atraumatic.  Right Ear: Tympanic membrane, external ear and ear canal normal.  Left Ear: Tympanic membrane, external ear and ear canal normal.  Eyes: Pupils are equal, round, and reactive to light. Conjunctivae and EOM are normal.  Neck: Normal range of motion. Neck supple.   Cardiovascular: Normal rate, regular rhythm and normal heart sounds.  Pulmonary/Chest: Effort normal and breath sounds normal.  Neurological: She is alert and oriented to person, place, and time.  Skin: Skin is warm and dry.  Psychiatric: She has a normal mood and affect. Her behavior is normal. Thought content normal.       Assessment & Plan:  Marland KitchenMarland KitchenDiagnoses and all orders for this visit:  Cough -     montelukast (SINGULAIR) 10 MG tablet; Take 1 tablet (10 mg total) by mouth at bedtime. -     DG Chest 2 View -     albuterol (PROVENTIL HFA;VENTOLIN HFA) 108 (90 Base) MCG/ACT inhaler; Inhale 2 puffs into the lungs every 6 (six) hours as needed for wheezing or shortness of breath.  SOB (shortness of breath) -     montelukast (SINGULAIR) 10 MG tablet; Take 1 tablet (10 mg total) by mouth at bedtime. -     albuterol (PROVENTIL HFA;VENTOLIN HFA) 108 (90 Base) MCG/ACT inhaler; Inhale 2 puffs into the lungs every 6 (six) hours as needed for wheezing or shortness of breath.  Peak flows were in yellow. duoneb given in office. Post peak flows improved only slightly but most were still in yellow. Pt did report to "feel better".   I would like to rule out a reactive airway disease vs asthma. Pt will come back in 1 week for spironmetry.  Get CXR.  Continue singulair and claritin. Added albuterol for as needed symptoms.   Marland Kitchen.Spent 30 minutes with patient and greater than 50 percent of visit spent counseling  patient regarding treatment plan.

## 2018-04-07 ENCOUNTER — Encounter: Payer: Self-pay | Admitting: Physician Assistant

## 2018-04-07 DIAGNOSIS — R05 Cough: Secondary | ICD-10-CM | POA: Insufficient documentation

## 2018-04-07 DIAGNOSIS — R059 Cough, unspecified: Secondary | ICD-10-CM | POA: Insufficient documentation

## 2018-04-07 DIAGNOSIS — R0602 Shortness of breath: Secondary | ICD-10-CM | POA: Insufficient documentation

## 2018-04-08 ENCOUNTER — Ambulatory Visit (INDEPENDENT_AMBULATORY_CARE_PROVIDER_SITE_OTHER): Payer: 59

## 2018-04-08 DIAGNOSIS — R05 Cough: Secondary | ICD-10-CM | POA: Diagnosis not present

## 2018-04-10 NOTE — Progress Notes (Signed)
Call pt: some signs of bronchitic changes. No masses. Will know more with spirometry testing.

## 2018-04-11 ENCOUNTER — Ambulatory Visit (INDEPENDENT_AMBULATORY_CARE_PROVIDER_SITE_OTHER): Payer: 59 | Admitting: Physician Assistant

## 2018-04-11 VITALS — BP 107/62 | HR 91 | Temp 98.0°F | Resp 18 | Ht 61.0 in | Wt 146.0 lb

## 2018-04-11 DIAGNOSIS — J301 Allergic rhinitis due to pollen: Secondary | ICD-10-CM

## 2018-04-11 DIAGNOSIS — R0602 Shortness of breath: Secondary | ICD-10-CM

## 2018-04-11 DIAGNOSIS — J4520 Mild intermittent asthma, uncomplicated: Secondary | ICD-10-CM

## 2018-04-11 MED ORDER — FLUTICASONE PROPIONATE 50 MCG/ACT NA SUSP: 2.0000 | Freq: Every day | NASAL | 5 refills | Status: DC

## 2018-04-11 MED ORDER — ALBUTEROL SULFATE (2.5 MG/3ML) 0.083% IN NEBU
2.5000 mg | INHALATION_SOLUTION | Freq: Once | RESPIRATORY_TRACT | Status: AC
  Administered 2018-04-11: 2.5 mg via RESPIRATORY_TRACT

## 2018-04-11 MED FILL — FLUTICASONE PROP 50 MCG SPR: 50 | 30 days supply | Qty: 16 | Fill #0

## 2018-04-11 NOTE — Progress Notes (Signed)
   Subjective:    Patient ID: Donna Ford, female    DOB: Aug 11, 1966, 52 y.o.   MRN: [id number]  HPI Pt is a 52 yo female with SOB/cough/seasonal allergies.  She comes in for spirometry.  She has not had any Singulair in 1 week and not use any rescue inhalers.  She is asymptomatic and feels good today.  .. Active Ambulatory Problems    Diagnosis Date Noted  . Esophageal reflux 10/19/2014  . External hemorrhoid 10/19/2014  . Rectal pain 10/22/2014  . Rhinitis, allergic 08/07/2015  . Post menopausal problems 08/07/2015  . Nasal congestion 08/07/2015  . Hyperlipemia 10/10/2015  . Recurrent major depressive disorder, in remission ([place]) 05/12/2017  . Trigger finger of both hands 05/12/2017  . Cough 04/07/2018  . SOB (shortness of breath) 04/07/2018  . Reactive airway disease 04/12/2018   Resolved Ambulatory Problems    Diagnosis Date Noted  . No Resolved Ambulatory Problems   No Additional Past Medical History      Review of Systems  All other systems reviewed and are negative.      Objective:   Physical Exam  Constitutional: She is oriented to person, place, and time. She appears well-developed and well-nourished.  HENT:  Head: Normocephalic and atraumatic.  Cardiovascular: Normal rate and regular rhythm.  Pulmonary/Chest: Effort normal.  Neurological: She is alert and oriented to person, place, and time.  Psychiatric: She has a normal mood and affect.          Assessment & Plan:  Marland KitchenMarland KitchenLamanda was seen today for shortness of breath.  Diagnoses and all orders for this visit:  SOB (shortness of breath) -     Spirometry with graph  Mild intermittent reactive airway disease without complication -     albuterol (PROVENTIL) (2.5 MG/3ML) 0.083% nebulizer solution 2.5 mg  Seasonal allergic rhinitis due to pollen -     fluticasone (FLONASE) 50 MCG/ACT nasal spray; Place 2 sprays into both nostrils daily.  Other orders -     Discontinue: fluticasone (FLONASE) 50  MCG/ACT nasal spray; Place 2 sprays into both nostrils daily.   FEV1 is 96 percent. FEV1/FVC 79.5. No change with albuterol. Normal spirometry.  Reassured patient that spirometry was normal today.  She certainly appears to have allergies that could be exacerbated with some reactive airway disease.  I did give her an inhaler to use as needed for shortness of breath.  There is no need for daily preventative inhaler.  I would continue on Zyrtec or Claritin as well as Singulair daily and flonase she could consider going off the Singulair when is not pollen season. Follow up as needed.

## 2018-04-12 DIAGNOSIS — J45909 Unspecified asthma, uncomplicated: Secondary | ICD-10-CM | POA: Insufficient documentation

## 2018-05-11 ENCOUNTER — Encounter: Payer: Self-pay | Admitting: Physician Assistant

## 2018-05-11 ENCOUNTER — Ambulatory Visit (INDEPENDENT_AMBULATORY_CARE_PROVIDER_SITE_OTHER): Payer: 59 | Admitting: Physician Assistant

## 2018-05-11 VITALS — BP 115/59 | HR 82 | Ht 61.0 in | Wt 145.0 lb

## 2018-05-11 DIAGNOSIS — E785 Hyperlipidemia, unspecified: Secondary | ICD-10-CM | POA: Diagnosis not present

## 2018-05-11 DIAGNOSIS — Z131 Encounter for screening for diabetes mellitus: Secondary | ICD-10-CM | POA: Diagnosis not present

## 2018-05-11 DIAGNOSIS — R5383 Other fatigue: Secondary | ICD-10-CM

## 2018-05-11 DIAGNOSIS — Z0000 Encounter for general adult medical examination without abnormal findings: Secondary | ICD-10-CM | POA: Diagnosis not present

## 2018-05-11 NOTE — Patient Instructions (Addendum)
Slim roast coffee.   Keeping You Healthy  Get These Tests  Blood Pressure- Have your blood pressure checked by your healthcare provider at least once a year.  Normal blood pressure is 120/80.  Weight- Have your body mass index (BMI) calculated to screen for obesity.  BMI is a measure of body fat based on height and weight.  You can calculate your own BMI at [employer]  Cholesterol- Have your cholesterol checked every year.  Diabetes- Have your blood sugar checked every year if you have high blood pressure, high cholesterol, a family history of diabetes or if you are overweight.  Pap Test - Have a pap test every 1 to 5 years if you have been sexually active.  If you are older than 65 and recent pap tests have been normal you may not need additional pap tests.  In addition, if you have had a hysterectomy  for benign disease additional pap tests are not necessary.  Mammogram-Yearly mammograms are essential for early detection of breast cancer  Screening for Colon Cancer- Colonoscopy starting at age 13. Screening may begin sooner depending on your family history and other health conditions.  Follow up colonoscopy as directed by your Gastroenterologist.  Screening for Osteoporosis- Screening begins at age 52 with bone density scanning, sooner if you are at higher risk for developing Osteoporosis.  Get these medicines  Calcium with Vitamin D- Your body requires 1200-1500 mg of Calcium a day and [phone] IU of Vitamin D a day.  You can only absorb 500 mg of Calcium at a time therefore Calcium must be taken in 2 or 3 separate doses throughout the day.  Hormones- Hormone therapy has been associated with increased risk for certain cancers and heart disease.  Talk to your healthcare provider about if you need relief from menopausal symptoms.  Aspirin- Ask your healthcare provider about taking Aspirin to prevent Heart Disease and Stroke.  Get these Immuniztions  Flu shot- Every  fall  Pneumonia shot- Once after the age of 32; if you are younger ask your healthcare provider if you need a pneumonia shot.  Tetanus- Every ten years.  Zostavax- Once after the age of 22 to prevent shingles.  Take these steps  Don't smoke- Your healthcare provider can help you quit. For tips on how to quit, ask your healthcare provider or go to www.smokefree.gov or call 1-800 QUIT-NOW.  Be physically active- Exercise 5 days a week for a minimum of 30 minutes.  If you are not already physically active, start slow and gradually work up to 30 minutes of moderate physical activity.  Try walking, dancing, bike riding, swimming, etc.  Eat a healthy diet- Eat a variety of healthy foods such as fruits, vegetables, whole grains, low fat milk, low fat cheeses, yogurt, lean meats, chicken, fish, eggs, dried beans, tofu, etc.  For more information go to www.thenutritionsource.org  Dental visit- Brush and floss teeth twice daily; visit your dentist twice a year.  Eye exam- Visit your Optometrist or Ophthalmologist yearly.  Drink alcohol in moderation- Limit alcohol intake to one drink or less a day.  Never drink and drive.  Depression- Your emotional health is as important as your physical health.  If you're feeling down or losing interest in things you normally enjoy, please talk to your healthcare provider.  Seat Belts- can save your life; always wear one  Smoke/Carbon Monoxide detectors- These detectors need to be installed on the appropriate level of your home.  Replace batteries at least once  a year.  Violence- If anyone is threatening or hurting you, please tell your healthcare provider.  Living Will/ Health care power of attorney- Discuss with your healthcare provider and family.

## 2018-05-11 NOTE — Progress Notes (Signed)
Subjective:     Donna Ford is a 52 y.o. female and is here for a comprehensive physical exam. The patient reports no problems.  She does mention interest in vitamin testing for some occasional low energy. She does work 12hr days and that is mostly when she is tired.   Social History   Socioeconomic History  . Marital status: Married    Spouse name: Not on file  . Number of children: Not on file  . Years of education: Not on file  . Highest education level: Not on file  Occupational History  . Not on file  Social Needs  . Financial resource strain: Not on file  . Food insecurity:    Worry: Not on file    Inability: Not on file  . Transportation needs:    Medical: Not on file    Non-medical: Not on file  Tobacco Use  . Smoking status: Never Smoker  . Smokeless tobacco: Never Used  Substance and Sexual Activity  . Alcohol use: No    Alcohol/week: 0.0 oz  . Drug use: No  . Sexual activity: Yes  Lifestyle  . Physical activity:    Days per week: Not on file    Minutes per session: Not on file  . Stress: Not on file  Relationships  . Social connections:    Talks on phone: Not on file    Gets together: Not on file    Attends religious service: Not on file    Active member of club or organization: Not on file    Attends meetings of clubs or organizations: Not on file    Relationship status: Not on file  . Intimate partner violence:    Fear of current or ex partner: Not on file    Emotionally abused: Not on file    Physically abused: Not on file    Forced sexual activity: Not on file  Other Topics Concern  . Not on file  Social History Narrative  . Not on file   Health Maintenance  Topic Date Due  . INFLUENZA VACCINE  07/07/2018  . MAMMOGRAM  10/23/2019  . TETANUS/TDAP  12/08/2023  . COLONOSCOPY  05/14/2025  . Hepatitis C Screening  Completed  . HIV Screening  Completed    The following portions of the patient's history were reviewed and updated as  appropriate: allergies, current medications, past family history, past medical history, past social history, past surgical history and problem list.  Review of Systems A comprehensive review of systems was negative.   Objective:    BP (!) 115/59   Pulse 82   Ht 5\' 1"  (1.549 m)   Wt 145 lb (65.8 kg)   BMI 27.40 kg/m  General appearance: alert, cooperative and appears stated age Head: Normocephalic, without obvious abnormality, atraumatic Eyes: conjunctivae/corneas clear. PERRL, EOM's intact. Fundi benign. Ears: normal TM's and external ear canals both ears Nose: Nares normal. Septum midline. Mucosa normal. No drainage or sinus tenderness. Throat: lips, mucosa, and tongue normal; teeth and gums normal Neck: no adenopathy, no carotid bruit, no JVD, supple, symmetrical, trachea midline and thyroid not enlarged, symmetric, no tenderness/mass/nodules Back: symmetric, no curvature. ROM normal. No CVA tenderness. Lungs: clear to auscultation bilaterally Heart: regular rate and rhythm, S1, S2 normal, no murmur, click, rub or gallop Abdomen: soft, non-tender; bowel sounds normal; no masses,  no organomegaly Extremities: extremities normal, atraumatic, no cyanosis or edema Pulses: 2+ and symmetric Skin: Skin color, texture, turgor normal. No rashes or lesions  Lymph nodes: Cervical, supraclavicular, and axillary nodes normal. Neurologic: Alert and oriented X 3, normal strength and tone. Normal symmetric reflexes. Normal coordination and gait    Assessment:    Healthy female exam.      Plan:    Marland KitchenMarland KitchenLenora was seen today for annual exam.  Diagnoses and all orders for this visit:  Routine physical examination -     Lipid Panel w/reflex Direct LDL -     COMPLETE METABOLIC PANEL WITH GFR -     Vitamin D 1,25 dihydroxy -     B12 and Folate Panel  Screening for diabetes mellitus -     COMPLETE METABOLIC PANEL WITH GFR  Dyslipidemia -     Lipid Panel w/reflex Direct LDL  No energy -      Vitamin D 1,25 dihydroxy -     B12 and Folate Panel   .Marland Kitchen Depression screen [facility] 2/9 05/11/2018 05/12/2017  Decreased Interest 0 0  Down, Depressed, Hopeless 0 0  PHQ - 2 Score 0 0  Altered sleeping - 0  Tired, decreased energy - 0  Change in appetite - 0  Feeling bad or failure about yourself  - 0  Trouble concentrating - 0  Moving slowly or fidgety/restless - 0  Suicidal thoughts - 0  PHQ-9 Score - 0   .Marland Kitchen Discussed 150 minutes of exercise a week.  Encouraged vitamin D 1000 units and Calcium 1300mg  or 4 servings of dairy a day.  Mammogram/PAP up to date.  Colonoscopy up to date.  Fasting labs ordered.  Encouraged shingrix and information given.   Discussed elevated cholesterol on last labs and risk factors for stroke/MI. Would like for patient to consider statin therapy.   See After Visit Summary for Counseling Recommendations

## 2018-05-12 DIAGNOSIS — E785 Hyperlipidemia, unspecified: Secondary | ICD-10-CM | POA: Insufficient documentation

## 2018-05-19 DIAGNOSIS — Z0000 Encounter for general adult medical examination without abnormal findings: Secondary | ICD-10-CM | POA: Diagnosis not present

## 2018-05-19 DIAGNOSIS — R5383 Other fatigue: Secondary | ICD-10-CM | POA: Diagnosis not present

## 2018-05-24 LAB — VITAMIN D 1,25 DIHYDROXY
VITAMIN D 1, 25 (OH) TOTAL: 36 pg/mL (ref 18–72)
VITAMIN D3 1, 25 (OH): 36 pg/mL

## 2018-05-24 LAB — B12 AND FOLATE PANEL
FOLATE: 10.5 ng/mL
Vitamin B-12: 383 pg/mL (ref 200–1100)

## 2018-05-24 NOTE — Progress Notes (Signed)
Call pt: vitamin D is normal but low side of normal. I would take at 1000 to 2000 units daily. b12 low normal I would take 1064mcg daily to see if this helps with energy a little.

## 2018-06-08 DIAGNOSIS — Z131 Encounter for screening for diabetes mellitus: Secondary | ICD-10-CM | POA: Diagnosis not present

## 2018-06-08 DIAGNOSIS — E785 Hyperlipidemia, unspecified: Secondary | ICD-10-CM | POA: Diagnosis not present

## 2018-06-08 LAB — LIPID PANEL W/REFLEX DIRECT LDL
CHOL/HDL RATIO: 5.3 (calc) — AB (ref <5.0)
Cholesterol: 302 mg/dL — ABNORMAL HIGH (ref <200)
HDL: 57 mg/dL (ref >50)
LDL CHOLESTEROL (CALC): 219 mg/dL — AB
NON-HDL CHOLESTEROL (CALC): 245 mg/dL — AB (ref <130)
TRIGLYCERIDES: 115 mg/dL (ref <150)

## 2018-06-08 LAB — COMPLETE METABOLIC PANEL WITH GFR
AG Ratio: 1.8 (calc) (ref 1.0–2.5)
ALBUMIN MSPROF: 4.2 g/dL (ref 3.6–5.1)
ALKALINE PHOSPHATASE (APISO): 69 U/L (ref 33–130)
ALT: 11 U/L (ref 6–29)
AST: 15 U/L (ref 10–35)
BILIRUBIN TOTAL: 0.5 mg/dL (ref 0.2–1.2)
BUN: 12 mg/dL (ref 7–25)
CHLORIDE: 104 mmol/L (ref 98–110)
CO2: 29 mmol/L (ref 20–32)
Calcium: 9.5 mg/dL (ref 8.6–10.4)
Creat: 0.71 mg/dL (ref 0.50–1.05)
GFR, Est African American: 113 mL/min/{1.73_m2} (ref >=60)
GFR, Est Non African American: 97 mL/min/{1.73_m2} (ref >=60)
Globulin: 2.3 g/dL (calc) (ref 1.9–3.7)
Glucose, Bld: 91 mg/dL (ref 65–99)
Potassium: 4.4 mmol/L (ref 3.5–5.3)
Sodium: 140 mmol/L (ref 135–146)
Total Protein: 6.5 g/dL (ref 6.1–8.1)

## 2018-06-10 NOTE — Progress Notes (Signed)
Call pt: LDL and overall cholesterol has worsened. HDL has improved as well. I strongly suggest statin to lower your cardiovascular risk. Your bad cholesterol is over 200.

## 2018-06-10 NOTE — Progress Notes (Signed)
Tried Atorvastatin and had cognitive issues so she is against trying statin. -[facility]

## 2018-06-14 NOTE — Progress Notes (Signed)
Ok this medication could be expensive and we have to submit prior authorization for approval. What are your thoughts on an injectable(every 2 weeks) PSK called repatha?   I am searching for something but your cardiac risk is very high and this concerns me.

## 2018-06-29 ENCOUNTER — Encounter: Payer: Self-pay | Admitting: Physician Assistant

## 2018-07-27 ENCOUNTER — Other Ambulatory Visit: Payer: Self-pay | Admitting: Physician Assistant

## 2018-07-27 DIAGNOSIS — F3340 Major depressive disorder, recurrent, in remission, unspecified: Secondary | ICD-10-CM

## 2018-07-27 MED FILL — ESCITALOPRAM 10 MG TABLET: 10 | 90 days supply | Qty: 90 | Fill #0

## 2018-08-12 ENCOUNTER — Ambulatory Visit (INDEPENDENT_AMBULATORY_CARE_PROVIDER_SITE_OTHER): Payer: 59 | Admitting: Family Medicine

## 2018-08-12 ENCOUNTER — Encounter: Payer: Self-pay | Admitting: Family Medicine

## 2018-08-12 VITALS — BP 117/60 | HR 68 | Ht 61.0 in | Wt 147.0 lb

## 2018-08-12 DIAGNOSIS — N8110 Cystocele, unspecified: Secondary | ICD-10-CM

## 2018-08-12 DIAGNOSIS — R3915 Urgency of urination: Secondary | ICD-10-CM | POA: Diagnosis not present

## 2018-08-12 LAB — POCT URINALYSIS DIPSTICK
BILIRUBIN UA: NEGATIVE
Glucose, UA: NEGATIVE
KETONES UA: NEGATIVE
Leukocytes, UA: NEGATIVE
NITRITE UA: NEGATIVE
Protein, UA: NEGATIVE
RBC UA: NEGATIVE
SPEC GRAV UA: 1.02 (ref 1.010–1.025)
Urobilinogen, UA: 0.2 E.U./dL
pH, UA: 7 (ref 5.0–8.0)

## 2018-08-12 NOTE — Progress Notes (Signed)
Subjective:    Patient ID: Donna Ford, female    DOB: 29-Jan-1966, 52 y.o.   MRN: [id number]  HPI   52 year old female comes in today because of some vaginal symptoms she says about a week or so ago she and her husband were going kayaking when she first sat down the kayak she had sharp intense pain in the vaginal area.  Mother time they finished kayaking it was actually better.  Then a few days ago she actually slipped a little bit try to get into her husband's truck and landed on her bottom and again had that sharp discomfort again.  She has not had any dysuria.  She has had some urinary urgency though which is not new it is actually been going on for the last year or so.  Occasionally she might have some dribbling or leakage but is not very often.  She does report having had a vaginal hysterectomy when she was 52 years old.  She also occasionally has some low back pain mostly in the mornings when she first gets out of bed but she had an old cheerleading injury when she was younger that she thinks likely has caused that.  She does feel like she is had more stiffness and soreness in her low back over the last week or so.  No fevers chills or sweats.   Review of Systems BP 117/60   Pulse 68   Ht 5\' 1"  (1.549 m)   Wt 147 lb (66.7 kg)   SpO2 97%   BMI 27.78 kg/m     Allergies  Allergen Reactions  . Atorvastatin Other (See Comments)    Neurological & vision changes    No past medical history on file.  Past Surgical History:  Procedure Laterality Date  . ABDOMINAL HYSTERECTOMY      Social History   Socioeconomic History  . Marital status: Married    Spouse name: Not on file  . Number of children: Not on file  . Years of education: Not on file  . Highest education level: Not on file  Occupational History  . Not on file  Social Needs  . Financial resource strain: Not on file  . Food insecurity:    Worry: Not on file    Inability: Not on file  . Transportation needs:   Medical: Not on file    Non-medical: Not on file  Tobacco Use  . Smoking status: Never Smoker  . Smokeless tobacco: Never Used  Substance and Sexual Activity  . Alcohol use: No    Alcohol/week: 0.0 standard drinks  . Drug use: No  . Sexual activity: Yes  Lifestyle  . Physical activity:    Days per week: Not on file    Minutes per session: Not on file  . Stress: Not on file  Relationships  . Social connections:    Talks on phone: Not on file    Gets together: Not on file    Attends religious service: Not on file    Active member of club or organization: Not on file    Attends meetings of clubs or organizations: Not on file    Relationship status: Not on file  . Intimate partner violence:    Fear of current or ex partner: Not on file    Emotionally abused: Not on file    Physically abused: Not on file    Forced sexual activity: Not on file  Other Topics Concern  . Not on file  Social  History Narrative  . Not on file    Family History  Problem Relation Age of Onset  . Heart attack Father 108       open heart at 36  . Hypertension Father     Outpatient Encounter Medications as of 08/12/2018  Medication Sig  . escitalopram (LEXAPRO) 10 MG tablet TAKE 1 TABLET (10 MG TOTAL) BY MOUTH DAILY.  . [DISCONTINUED] albuterol (PROVENTIL HFA;VENTOLIN HFA) 108 (90 Base) MCG/ACT inhaler Inhale 2 puffs into the lungs every 6 (six) hours as needed for wheezing or shortness of breath.  . [DISCONTINUED] fluticasone (FLONASE) 50 MCG/ACT nasal spray Place 2 sprays into both nostrils daily.  . [DISCONTINUED] loratadine (CLARITIN) 10 MG tablet Take 10 mg by mouth daily.  . [DISCONTINUED] montelukast (SINGULAIR) 10 MG tablet Take 1 tablet (10 mg total) by mouth at bedtime.   No facility-administered encounter medications on file as of 08/12/2018.          Objective:   Physical Exam  Constitutional: She is oriented to person, place, and time. She appears well-developed and well-nourished.    HENT:  Head: Normocephalic and atraumatic.  Eyes: Conjunctivae and EOM are normal.  Cardiovascular: Normal rate.  Pulmonary/Chest: Effort normal.  Genitourinary:    There is no rash on the right labia. There is no rash on the left labia. Right adnexum displays no mass. Left adnexum displays no mass.  Neurological: She is alert and oriented to person, place, and time.  Skin: Skin is dry. No pallor.  Psychiatric: She has a normal mood and affect. Her behavior is normal.  Vitals reviewed.       Assessment & Plan:  Bladder prolapse-discussed options.  Will for refer to GYN for further evaluation and treatment options.  For now can put pressure on the area if needed to push it back into the vaginal canal.  Analysis was negative for any urinary tract infection.  Urinary urgency-she also complains of urgency which could be related to the bladder prolapse.  But it could also be a separate issue such as OAB.  Again can discuss with gynecology at follow-up

## 2018-08-30 ENCOUNTER — Telehealth: Payer: Self-pay

## 2018-08-30 DIAGNOSIS — R3915 Urgency of urination: Secondary | ICD-10-CM

## 2018-08-30 DIAGNOSIS — N8110 Cystocele, unspecified: Secondary | ICD-10-CM

## 2018-08-30 NOTE — Telephone Encounter (Signed)
OK to place new Urolog referral as below and cancel gyn ref. Thank you

## 2018-08-30 NOTE — Telephone Encounter (Signed)
Pt was seen for bladder issues and was originally referred to GYN for evaluation, but now wants to see urologist instead.   Pt has cancelled her appt with GYN and wants new referral placed for Dr Matilde Sprang at [facility]   OK for referral?

## 2018-08-31 ENCOUNTER — Encounter: Payer: 59 | Admitting: Obstetrics and Gynecology

## 2018-08-31 DIAGNOSIS — N8111 Cystocele, midline: Secondary | ICD-10-CM | POA: Diagnosis not present

## 2018-08-31 DIAGNOSIS — R350 Frequency of micturition: Secondary | ICD-10-CM | POA: Diagnosis not present

## 2018-08-31 DIAGNOSIS — N393 Stress incontinence (female) (male): Secondary | ICD-10-CM | POA: Diagnosis not present

## 2018-08-31 DIAGNOSIS — R3914 Feeling of incomplete bladder emptying: Secondary | ICD-10-CM | POA: Diagnosis not present

## 2018-08-31 MED FILL — PREMARIN VAGINAL CREAM-APPL: 0.625 | 30 days supply | Qty: 30 | Fill #0

## 2018-08-31 NOTE — Telephone Encounter (Signed)
Urology referral added, can you please cancel GYN? Thanks!

## 2018-08-31 NOTE — Telephone Encounter (Signed)
Appointment was canceled for OBGYN and closed and referral was sent to Urology - [facility]

## 2018-09-07 DIAGNOSIS — L821 Other seborrheic keratosis: Secondary | ICD-10-CM | POA: Diagnosis not present

## 2018-09-07 DIAGNOSIS — L814 Other melanin hyperpigmentation: Secondary | ICD-10-CM | POA: Diagnosis not present

## 2018-09-07 DIAGNOSIS — D1801 Hemangioma of skin and subcutaneous tissue: Secondary | ICD-10-CM | POA: Diagnosis not present

## 2018-09-07 DIAGNOSIS — D2261 Melanocytic nevi of right upper limb, including shoulder: Secondary | ICD-10-CM | POA: Diagnosis not present

## 2018-09-07 DIAGNOSIS — L570 Actinic keratosis: Secondary | ICD-10-CM | POA: Diagnosis not present

## 2018-09-07 DIAGNOSIS — D2372 Other benign neoplasm of skin of left lower limb, including hip: Secondary | ICD-10-CM | POA: Diagnosis not present

## 2018-09-07 DIAGNOSIS — D0462 Carcinoma in situ of skin of left upper limb, including shoulder: Secondary | ICD-10-CM | POA: Diagnosis not present

## 2018-09-07 DIAGNOSIS — D225 Melanocytic nevi of trunk: Secondary | ICD-10-CM | POA: Diagnosis not present

## 2018-09-16 DIAGNOSIS — D0462 Carcinoma in situ of skin of left upper limb, including shoulder: Secondary | ICD-10-CM | POA: Diagnosis not present

## 2018-09-16 DIAGNOSIS — L570 Actinic keratosis: Secondary | ICD-10-CM | POA: Diagnosis not present

## 2018-09-21 ENCOUNTER — Other Ambulatory Visit: Payer: Self-pay | Admitting: Physician Assistant

## 2018-09-21 DIAGNOSIS — Z1239 Encounter for other screening for malignant neoplasm of breast: Secondary | ICD-10-CM

## 2018-10-10 MED FILL — FLUTICASONE PROP 50 MCG SPR: 50 | 30 days supply | Qty: 16 | Fill #0

## 2018-10-26 ENCOUNTER — Ambulatory Visit (INDEPENDENT_AMBULATORY_CARE_PROVIDER_SITE_OTHER): Payer: 59

## 2018-10-26 DIAGNOSIS — Z1231 Encounter for screening mammogram for malignant neoplasm of breast: Secondary | ICD-10-CM | POA: Diagnosis not present

## 2018-10-26 DIAGNOSIS — H5201 Hypermetropia, right eye: Secondary | ICD-10-CM | POA: Diagnosis not present

## 2018-10-26 DIAGNOSIS — H52222 Regular astigmatism, left eye: Secondary | ICD-10-CM | POA: Diagnosis not present

## 2018-10-26 DIAGNOSIS — Z1239 Encounter for other screening for malignant neoplasm of breast: Secondary | ICD-10-CM

## 2018-10-26 DIAGNOSIS — H5202 Hypermetropia, left eye: Secondary | ICD-10-CM | POA: Diagnosis not present

## 2018-10-26 DIAGNOSIS — H524 Presbyopia: Secondary | ICD-10-CM | POA: Diagnosis not present

## 2018-10-27 NOTE — Progress Notes (Signed)
Call pt: normal mammogram. Follow up in 1 year.

## 2018-10-31 MED FILL — ESCITALOPRAM 10 MG TABLET: 10 | 90 days supply | Qty: 90 | Fill #0

## 2019-01-06 MED FILL — FLUTICASONE PROP 50 MCG SPR: 50 | 30 days supply | Qty: 16 | Fill #0

## 2019-02-01 MED FILL — ESCITALOPRAM 10 MG TABLET: 10 | 90 days supply | Qty: 90 | Fill #0

## 2019-02-01 MED FILL — MONTELUKAST SOD 10 MG TAB: 10 | 90 days supply | Qty: 90 | Fill #0

## 2019-04-24 MED FILL — ESCITALOPRAM 10 MG TABLET: 10 | 90 days supply | Qty: 90 | Fill #1

## 2019-07-04 DIAGNOSIS — Z08 Encounter for follow-up examination after completed treatment for malignant neoplasm: Secondary | ICD-10-CM | POA: Diagnosis not present

## 2019-07-04 DIAGNOSIS — Z85828 Personal history of other malignant neoplasm of skin: Secondary | ICD-10-CM | POA: Diagnosis not present

## 2019-07-04 DIAGNOSIS — L820 Inflamed seborrheic keratosis: Secondary | ICD-10-CM | POA: Diagnosis not present

## 2019-07-04 DIAGNOSIS — D485 Neoplasm of uncertain behavior of skin: Secondary | ICD-10-CM | POA: Diagnosis not present

## 2019-07-04 DIAGNOSIS — L570 Actinic keratosis: Secondary | ICD-10-CM | POA: Diagnosis not present

## 2019-07-28 ENCOUNTER — Other Ambulatory Visit: Payer: Self-pay | Admitting: Physician Assistant

## 2019-07-28 DIAGNOSIS — F3340 Major depressive disorder, recurrent, in remission, unspecified: Secondary | ICD-10-CM

## 2019-07-28 MED FILL — ESCITALOPRAM 10 MG TABLET: 10 | 30 days supply | Qty: 30 | Fill #0

## 2019-08-23 ENCOUNTER — Ambulatory Visit (INDEPENDENT_AMBULATORY_CARE_PROVIDER_SITE_OTHER): Payer: 59 | Admitting: Physician Assistant

## 2019-08-23 ENCOUNTER — Encounter: Payer: Self-pay | Admitting: Physician Assistant

## 2019-08-23 ENCOUNTER — Telehealth: Payer: Self-pay | Admitting: Physician Assistant

## 2019-08-23 ENCOUNTER — Other Ambulatory Visit: Payer: Self-pay

## 2019-08-23 VITALS — BP 130/72 | HR 66 | Ht 61.5 in | Wt 151.0 lb

## 2019-08-23 DIAGNOSIS — F3340 Major depressive disorder, recurrent, in remission, unspecified: Secondary | ICD-10-CM | POA: Diagnosis not present

## 2019-08-23 DIAGNOSIS — Z131 Encounter for screening for diabetes mellitus: Secondary | ICD-10-CM

## 2019-08-23 DIAGNOSIS — Z0000 Encounter for general adult medical examination without abnormal findings: Secondary | ICD-10-CM

## 2019-08-23 DIAGNOSIS — E785 Hyperlipidemia, unspecified: Secondary | ICD-10-CM

## 2019-08-23 MED ORDER — ESCITALOPRAM OXALATE 10 MG PO TABS: 10.0000 mg | ORAL_TABLET | Freq: Every day | ORAL | 3 refills | Status: DC

## 2019-08-23 MED ORDER — REPATHA SURECLICK 140 MG/ML ~~LOC~~ SOAJ: 140.0000 mg | SUBCUTANEOUS | 5 refills | Status: DC

## 2019-08-23 MED FILL — ESCITALOPRAM 10 MG TABLET: 10 | 90 days supply | Qty: 90 | Fill #0

## 2019-08-23 NOTE — Patient Instructions (Signed)
Will consider repatha.   Health Maintenance, Female Adopting a healthy lifestyle and getting preventive care are important in promoting health and wellness. Ask your health care provider about:  The right schedule for you to have regular tests and exams.  Things you can do on your own to prevent diseases and keep yourself healthy. What should I know about diet, weight, and exercise? Eat a healthy diet   Eat a diet that includes plenty of vegetables, fruits, low-fat dairy products, and lean protein.  Do not eat a lot of foods that are high in solid fats, added sugars, or sodium. Maintain a healthy weight Body mass index (BMI) is used to identify weight problems. It estimates body fat based on height and weight. Your health care provider can help determine your BMI and help you achieve or maintain a healthy weight. Get regular exercise Get regular exercise. This is one of the most important things you can do for your health. Most adults should:  Exercise for at least 150 minutes each week. The exercise should increase your heart rate and make you sweat (moderate-intensity exercise).  Do strengthening exercises at least twice a week. This is in addition to the moderate-intensity exercise.  Spend less time sitting. Even light physical activity can be beneficial. Watch cholesterol and blood lipids Have your blood tested for lipids and cholesterol at 55 years of age, then have this test every 5 years. Have your cholesterol levels checked more often if:  Your lipid or cholesterol levels are high.  You are older than 55 years of age.  You are at high risk for heart disease. What should I know about cancer screening? Depending on your health history and family history, you may need to have cancer screening at various ages. This may include screening for:  Breast cancer.  Cervical cancer.  Colorectal cancer.  Skin cancer.  Lung cancer. What should I know about heart disease,  diabetes, and high blood pressure? Blood pressure and heart disease  High blood pressure causes heart disease and increases the risk of stroke. This is more likely to develop in people who have high blood pressure readings, are of African descent, or are overweight.  Have your blood pressure checked: ? Every 3-5 years if you are 40-55 years of age. ? Every year if you are 68 years old or older. Diabetes Have regular diabetes screenings. This checks your fasting blood sugar level. Have the screening done:  Once every three years after age 35 if you are at a normal weight and have a low risk for diabetes.  More often and at a younger age if you are overweight or have a high risk for diabetes. What should I know about preventing infection? Hepatitis B If you have a higher risk for hepatitis B, you should be screened for this virus. Talk with your health care provider to find out if you are at risk for hepatitis B infection. Hepatitis C Testing is recommended for:  Everyone born from [birth year] through 1967.  Anyone with known risk factors for hepatitis C. Sexually transmitted infections (STIs)  Get screened for STIs, including gonorrhea and chlamydia, if: ? You are sexually active and are younger than 55 years of age. ? You are older than 55 years of age and your health care provider tells you that you are at risk for this type of infection. ? Your sexual activity has changed since you were last screened, and you are at increased risk for chlamydia or gonorrhea. Ask  your health care provider if you are at risk.  Ask your health care provider about whether you are at high risk for HIV. Your health care provider may recommend a prescription medicine to help prevent HIV infection. If you choose to take medicine to prevent HIV, you should first get tested for HIV. You should then be tested every 3 months for as long as you are taking the medicine. Pregnancy  If you are about to stop having your  period (premenopausal) and you may become pregnant, seek counseling before you get pregnant.  Take 400 to 800 micrograms (mcg) of folic acid every day if you become pregnant.  Ask for birth control (contraception) if you want to prevent pregnancy. Osteoporosis and menopause Osteoporosis is a disease in which the bones lose minerals and strength with aging. This can result in bone fractures. If you are 55 years old or older, or if you are at risk for osteoporosis and fractures, ask your health care provider if you should:  Be screened for bone loss.  Take a calcium or vitamin D supplement to lower your risk of fractures.  Be given hormone replacement therapy (HRT) to treat symptoms of menopause. Follow these instructions at home: Lifestyle  Do not use any products that contain nicotine or tobacco, such as cigarettes, e-cigarettes, and chewing tobacco. If you need help quitting, ask your health care provider.  Do not use street drugs.  Do not share needles.  Ask your health care provider for help if you need support or information about quitting drugs. Alcohol use  Do not drink alcohol if: ? Your health care provider tells you not to drink. ? You are pregnant, may be pregnant, or are planning to become pregnant.  If you drink alcohol: ? Limit how much you use to 0-1 drink a day. ? Limit intake if you are breastfeeding.  Be aware of how much alcohol is in your drink. In the [place] one drink equals one 12 oz bottle of beer (355 mL), one 5 oz glass of wine (148 mL), or one 1 oz glass of hard liquor (44 mL). General instructions  Schedule regular health, dental, and eye exams.  Stay current with your vaccines.  Tell your health care provider if: ? You often feel depressed. ? You have ever been abused or do not feel safe at home. Summary  Adopting a healthy lifestyle and getting preventive care are important in promoting health and wellness.  Follow your health care provider's  instructions about healthy diet, exercising, and getting tested or screened for diseases.  Follow your health care provider's instructions on monitoring your cholesterol and blood pressure. This information is not intended to replace advice given to you by your health care provider. Make sure you discuss any questions you have with your health care provider. Document Released: 06/08/2011 Document Revised: 11/16/2018 Document Reviewed: 11/16/2018 Elsevier Patient Education  2020 Elsevier Inc.  

## 2019-08-23 NOTE — Progress Notes (Signed)
Subjective:     Donna Ford is a 53 y.o. female and is here for a comprehensive physical exam. The patient reports no problems.  Pt is walking at least 30 minutes every day. She has decreased fried, fatty, processed foods.   Social History   Socioeconomic History  . Marital status: Married    Spouse name: Not on file  . Number of children: Not on file  . Years of education: Not on file  . Highest education level: Not on file  Occupational History  . Not on file  Social Needs  . Financial resource strain: Not on file  . Food insecurity    Worry: Not on file    Inability: Not on file  . Transportation needs    Medical: Not on file    Non-medical: Not on file  Tobacco Use  . Smoking status: Never Smoker  . Smokeless tobacco: Never Used  Substance and Sexual Activity  . Alcohol use: No    Alcohol/week: 0.0 standard drinks  . Drug use: No  . Sexual activity: Yes  Lifestyle  . Physical activity    Days per week: Not on file    Minutes per session: Not on file  . Stress: Not on file  Relationships  . Social Herbalist on phone: Not on file    Gets together: Not on file    Attends religious service: Not on file    Active member of club or organization: Not on file    Attends meetings of clubs or organizations: Not on file    Relationship status: Not on file  . Intimate partner violence    Fear of current or ex partner: Not on file    Emotionally abused: Not on file    Physically abused: Not on file    Forced sexual activity: Not on file  Other Topics Concern  . Not on file  Social History Narrative  . Not on file   Health Maintenance  Topic Date Due  . INFLUENZA VACCINE  03/06/2020 (Originally 07/08/2019)  . MAMMOGRAM  10/26/2020  . TETANUS/TDAP  12/08/2023  . COLONOSCOPY  05/14/2025  . Hepatitis C Screening  Completed  . HIV Screening  Completed    The following portions of the patient's history were reviewed and updated as appropriate: allergies,  current medications, past family history, past medical history, past social history, past surgical history and problem list.  Review of Systems A comprehensive review of systems was negative.   Objective:    BP 130/72   Pulse 66   Ht 5' 1.5" (1.562 m)   Wt 151 lb (68.5 kg)   SpO2 97%   BMI 28.07 kg/m  General appearance: alert, cooperative and appears stated age Head: Normocephalic, without obvious abnormality, atraumatic Eyes: conjunctivae/corneas clear. PERRL, EOM's intact. Fundi benign. Ears: normal TM's and external ear canals both ears Nose: Nares normal. Septum midline. Mucosa normal. No drainage or sinus tenderness. Throat: lips, mucosa, and tongue normal; teeth and gums normal Neck: no adenopathy, no carotid bruit, no JVD, supple, symmetrical, trachea midline and thyroid not enlarged, symmetric, no tenderness/mass/nodules Back: symmetric, no curvature. ROM normal. No CVA tenderness. Lungs: clear to auscultation bilaterally Breasts: normal appearance, no masses or tenderness left nipple retracted slightly at the tip(hx of this) Heart: regular rate and rhythm, S1, S2 normal, no murmur, click, rub or gallop Abdomen: soft, non-tender; bowel sounds normal; no masses,  no organomegaly Extremities: extremities normal, atraumatic, no cyanosis or edema Pulses:  2+ and symmetric Skin: Skin color, texture, turgor normal. No rashes or lesions Lymph nodes: Cervical, supraclavicular, and axillary nodes normal. Neurologic: Alert and oriented X 3, normal strength and tone. Normal symmetric reflexes. Normal coordination and gait    Assessment:    Healthy female exam.      Plan:    Marland KitchenMarland KitchenBasma was seen today for annual exam.  Diagnoses and all orders for this visit:  Routine physical examination -     Lipid Panel w/reflex Direct LDL -     COMPLETE METABOLIC PANEL WITH GFR  Recurrent major depressive disorder, in remission ([place]) -     escitalopram (LEXAPRO) 10 MG tablet; Take 1 tablet  (10 mg total) by mouth daily.  Dyslipidemia -     Lipid Panel w/reflex Direct LDL  Screening for diabetes mellitus -     COMPLETE METABOLIC PANEL WITH GFR  Other orders -     Evolocumab (REPATHA SURECLICK) [id number] MG/ML SOAJ; Inject 140 mg into the skin every 14 (fourteen) days.   .. Depression screen [facility] 2/9 08/23/2019 05/11/2018 05/12/2017  Decreased Interest 0 0 0  Down, Depressed, Hopeless 0 0 0  PHQ - 2 Score 0 0 0  Altered sleeping 0 - 0  Tired, decreased energy 0 - 0  Change in appetite 0 - 0  Feeling bad or failure about yourself  0 - 0  Trouble concentrating 0 - 0  Moving slowly or fidgety/restless 0 - 0  Suicidal thoughts 0 - 0  PHQ-9 Score 0 - 0  Difficult doing work/chores Not difficult at all - -   .Marland Kitchen Discussed 150 minutes of exercise a week.  Encouraged vitamin D 1000 units and Calcium 1300mg  or 4 servings of dairy a day.  Mammogram UTD. No need for pap.  Pt declined STD screening.  Colonoscopy UTD. Fasting labs ordered.  Discussed shingles vaccine. Pt will consider.  Declined flu shot until she gets at work.   Refilled lexapro for 1 year. Doing great.   Pt scared of statins due to SE of lipitor. LDL over 200 last check. Will recheck today. Discussed repatha. Sent to pharmacy.    See After Visit Summary for Counseling Recommendations

## 2019-08-23 NOTE — Telephone Encounter (Signed)
Received fax from [employer] that Repatha requires a PA. Information has been sent to the insurance company. Awaiting determination.   

## 2019-08-28 MED FILL — REPATHA SURECLICK 140 MG/ML: 140 | 28 days supply | Qty: 2 | Fill #0

## 2019-08-28 NOTE — Telephone Encounter (Signed)
The request has been approved. The authorization is effective for a maximum of 12 fills from 08/25/2019 to 08/23/2020, as long as the member is enrolled in their current health plan. The request was reviewed and approved by a licensed clinical pharmacist. This request is approved for 54mL (2 syringes) per 28 days.  Pharmacy aware.

## 2019-09-27 ENCOUNTER — Other Ambulatory Visit: Payer: Self-pay | Admitting: Physician Assistant

## 2019-09-27 DIAGNOSIS — E785 Hyperlipidemia, unspecified: Secondary | ICD-10-CM | POA: Diagnosis not present

## 2019-09-27 DIAGNOSIS — Z1239 Encounter for other screening for malignant neoplasm of breast: Secondary | ICD-10-CM

## 2019-09-27 DIAGNOSIS — Z0000 Encounter for general adult medical examination without abnormal findings: Secondary | ICD-10-CM | POA: Diagnosis not present

## 2019-09-27 DIAGNOSIS — Z131 Encounter for screening for diabetes mellitus: Secondary | ICD-10-CM | POA: Diagnosis not present

## 2019-09-29 LAB — COMPLETE METABOLIC PANEL WITH GFR
AG Ratio: 2 (calc) (ref 1.0–2.5)
ALT: 13 U/L (ref 6–29)
AST: 15 U/L (ref 10–35)
Albumin: 4.3 g/dL (ref 3.6–5.1)
Alkaline phosphatase (APISO): 61 U/L (ref 37–153)
BUN: 10 mg/dL (ref 7–25)
CO2: 26 mmol/L (ref 20–32)
Calcium: 9.4 mg/dL (ref 8.6–10.4)
Chloride: 106 mmol/L (ref 98–110)
Creat: 0.67 mg/dL (ref 0.50–1.05)
GFR, Est African American: 115 mL/min/{1.73_m2} (ref >=60)
GFR, Est Non African American: 99 mL/min/{1.73_m2} (ref >=60)
Globulin: 2.1 g/dL (calc) (ref 1.9–3.7)
Glucose, Bld: 113 mg/dL — ABNORMAL HIGH (ref 65–99)
Potassium: 4.3 mmol/L (ref 3.5–5.3)
Sodium: 140 mmol/L (ref 135–146)
Total Bilirubin: 0.4 mg/dL (ref 0.2–1.2)
Total Protein: 6.4 g/dL (ref 6.1–8.1)

## 2019-09-29 LAB — LIPID PANEL W/REFLEX DIRECT LDL
Cholesterol: 323 mg/dL — ABNORMAL HIGH (ref <200)
HDL: 47 mg/dL — ABNORMAL LOW (ref >=50)
LDL Cholesterol (Calc): 239 mg/dL (calc) — ABNORMAL HIGH
Non-HDL Cholesterol (Calc): 276 mg/dL (calc) — ABNORMAL HIGH (ref <130)
Total CHOL/HDL Ratio: 6.9 (calc) — ABNORMAL HIGH (ref <5.0)
Triglycerides: 192 mg/dL — ABNORMAL HIGH (ref <150)

## 2019-09-29 LAB — HEMOGLOBIN A1C W/OUT EAG

## 2019-09-29 NOTE — Progress Notes (Signed)
Also your sugar fasting was elevated. Need to get a1c. How about you come into office and we discuss cholesterol issue and get a1c to make sure no diabetes?

## 2019-09-29 NOTE — Progress Notes (Signed)
Donna Ford,   Unfortunately cholesterol continues to worsen. Total up, HDL(good)down, TG up, LDL up.   I would like to try another statin. If just not willing and would like to consider another class of medication. It does not have the same mortality/morbidy data but can lower overall LDL.   Thoughts? I do not want you to have a CV event if we can prevent it.   Luvenia Starch

## 2019-10-02 ENCOUNTER — Encounter: Payer: Self-pay | Admitting: Physician Assistant

## 2019-10-02 NOTE — Progress Notes (Signed)
Looks like a1c was canceled. I will need her to come in to discuss cholesterol and get a1c!

## 2019-10-04 NOTE — Addendum Note (Signed)
Addended byAnnamaria Helling on: 10/04/2019 08:07 AM   Modules accepted: Orders

## 2019-10-10 ENCOUNTER — Encounter: Payer: Self-pay | Admitting: Physician Assistant

## 2019-10-10 DIAGNOSIS — R5383 Other fatigue: Secondary | ICD-10-CM

## 2019-10-10 DIAGNOSIS — R7301 Impaired fasting glucose: Secondary | ICD-10-CM

## 2019-10-10 DIAGNOSIS — Z1329 Encounter for screening for other suspected endocrine disorder: Secondary | ICD-10-CM

## 2019-10-18 IMAGING — MG DIGITAL SCREENING BILATERAL MAMMOGRAM WITH TOMO AND CAD
8 series · 8 of 24 positions shown · non-contrast
Comparison: Previous exam(s).

CLINICAL DATA: Screening.

EXAM:
DIGITAL SCREENING BILATERAL MAMMOGRAM WITH TOMO AND CAD

[L MLO synth-2D]
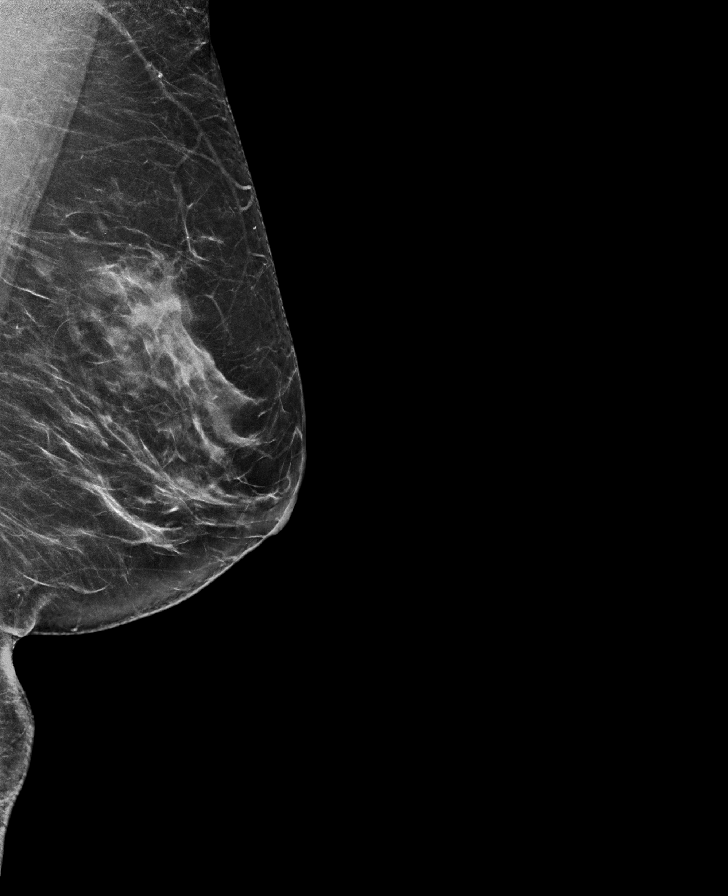

[L CC synth-2D]
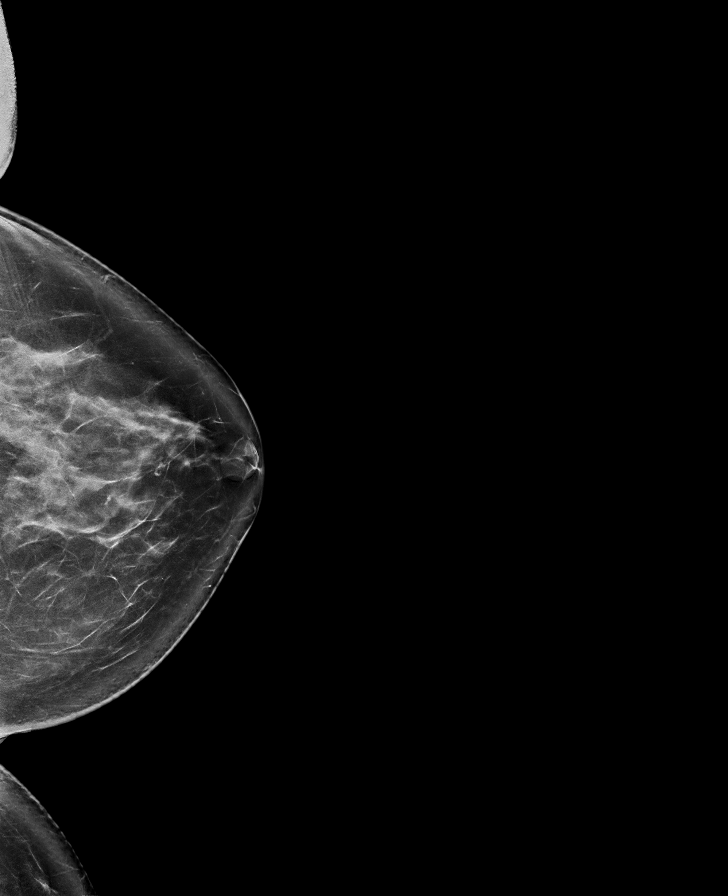

[R MLO synth-2D]
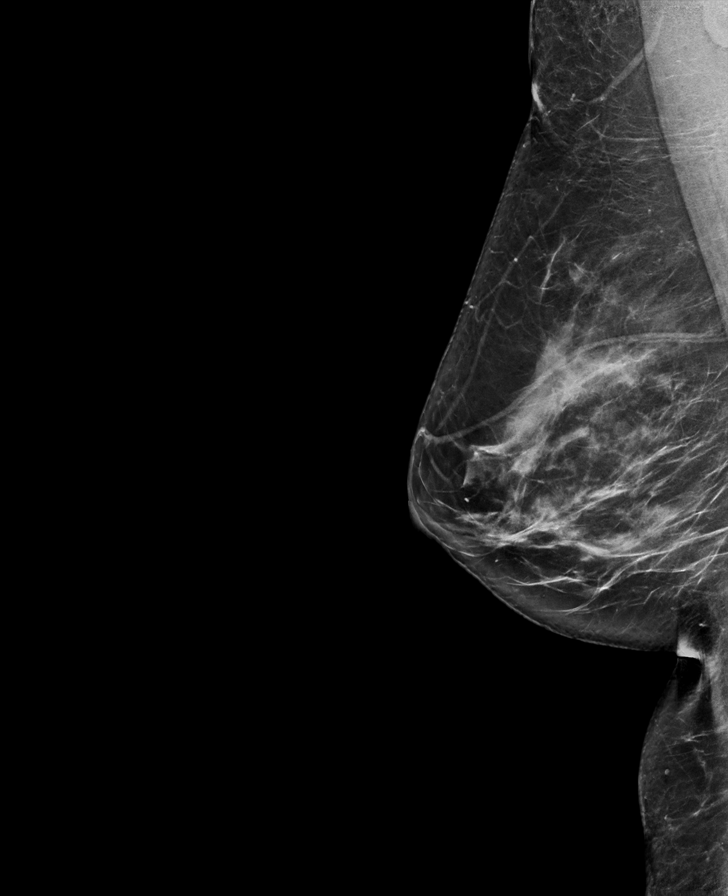

[R CC synth-2D]
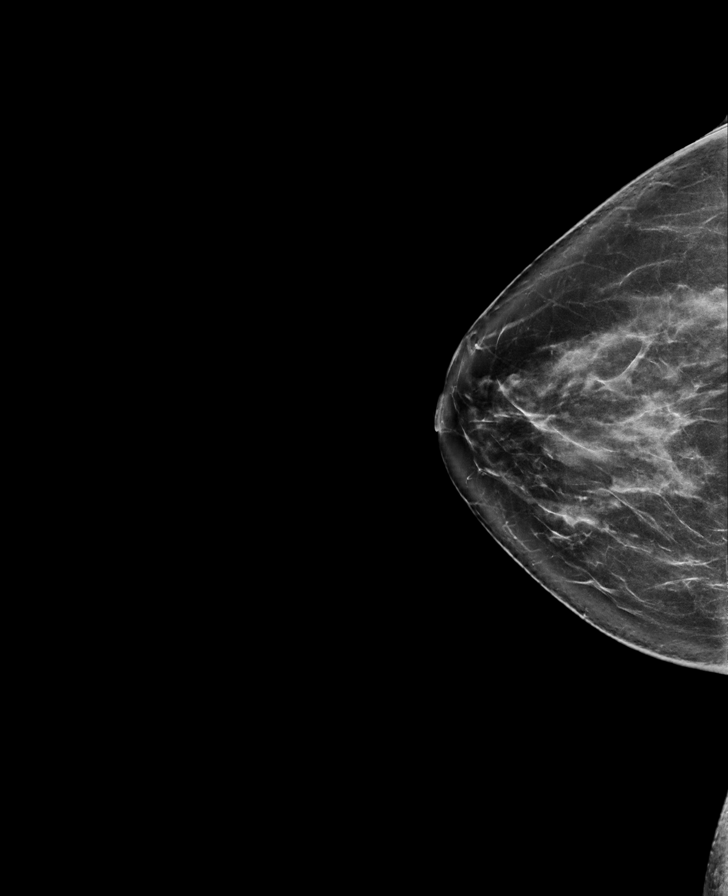

[L CC tomo · tomo slice 37/72.0]
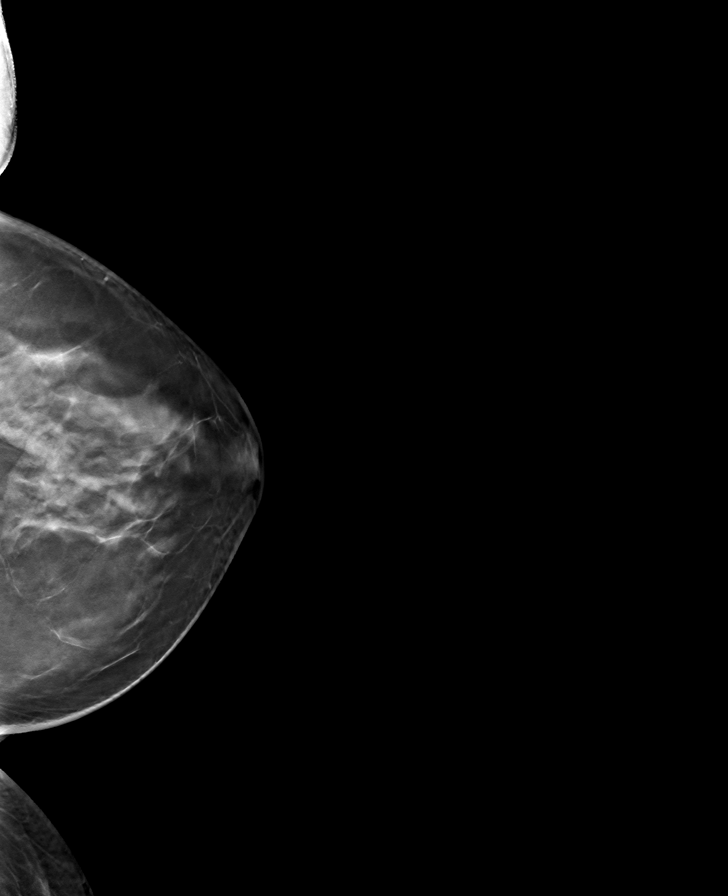

[L MLO tomo · tomo slice 37/73.0]
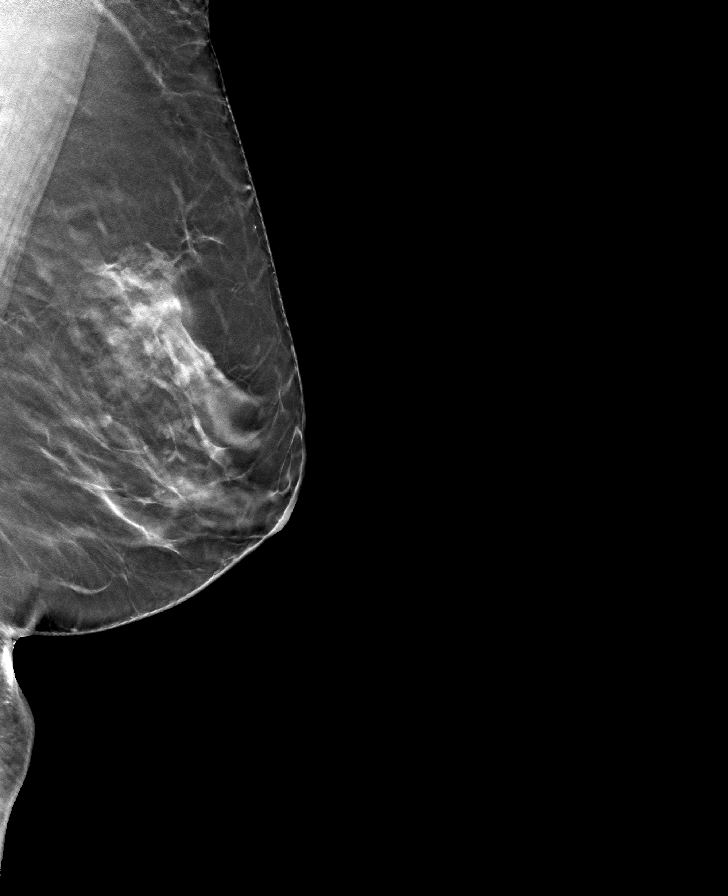

[R MLO tomo · tomo slice 38/75.0]
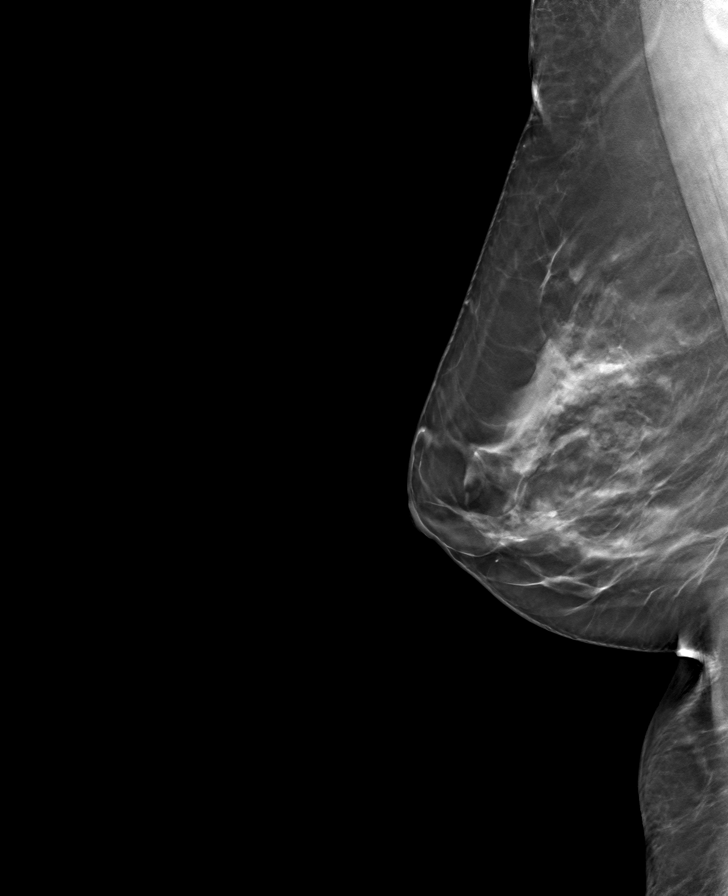

[R CC tomo · tomo slice 39/76.0]
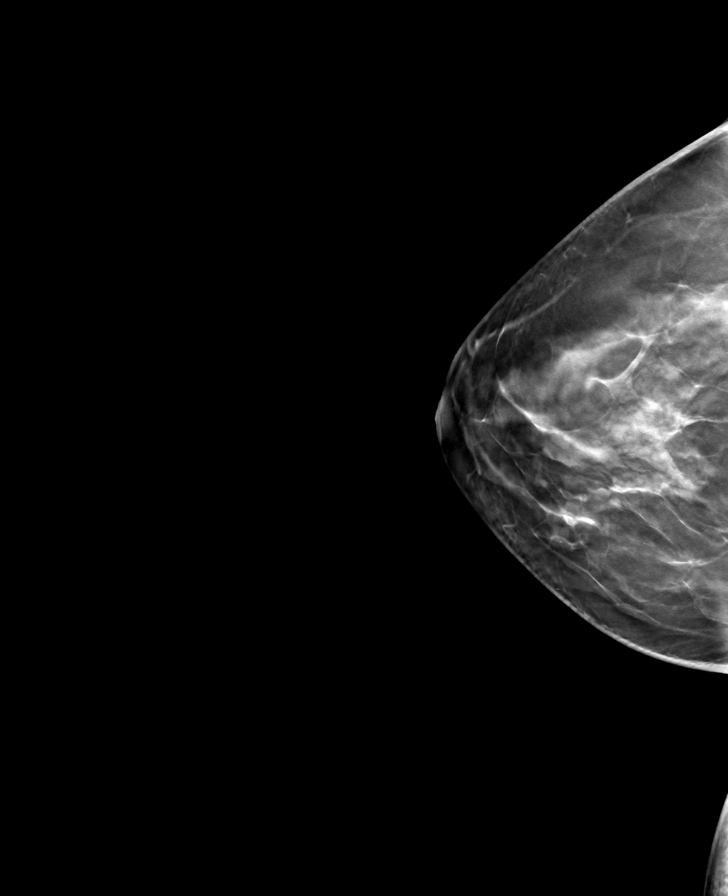

[8 of 24 positions shown; findings below may reference images not displayed]

ACR Breast Density Category c: The breast tissue is heterogeneously
dense, which may obscure small masses.
FINDINGS: There are no findings suspicious for malignancy. Images were
processed with CAD.
IMPRESSION: No mammographic evidence of malignancy. A result letter of this
screening mammogram will be mailed directly to the patient.

RECOMMENDATION:
Screening mammogram in one year. (Code:[id number]-[id number])

BI-RADS CATEGORY  1: Negative.

## 2019-11-01 ENCOUNTER — Other Ambulatory Visit: Payer: Self-pay

## 2019-11-01 ENCOUNTER — Ambulatory Visit (INDEPENDENT_AMBULATORY_CARE_PROVIDER_SITE_OTHER): Payer: 59

## 2019-11-01 DIAGNOSIS — H52222 Regular astigmatism, left eye: Secondary | ICD-10-CM | POA: Diagnosis not present

## 2019-11-01 DIAGNOSIS — H5201 Hypermetropia, right eye: Secondary | ICD-10-CM | POA: Diagnosis not present

## 2019-11-01 DIAGNOSIS — Z1231 Encounter for screening mammogram for malignant neoplasm of breast: Secondary | ICD-10-CM | POA: Diagnosis not present

## 2019-11-01 DIAGNOSIS — H524 Presbyopia: Secondary | ICD-10-CM | POA: Diagnosis not present

## 2019-11-01 DIAGNOSIS — Z1239 Encounter for other screening for malignant neoplasm of breast: Secondary | ICD-10-CM | POA: Diagnosis not present

## 2019-11-01 DIAGNOSIS — H5202 Hypermetropia, left eye: Secondary | ICD-10-CM | POA: Diagnosis not present

## 2019-11-05 NOTE — Progress Notes (Signed)
Normal mammogram. Follow up in 1 year.

## 2019-12-18 MED FILL — ESCITALOPRAM 10 MG TABLET: 10 | 90 days supply | Qty: 90 | Fill #1

## 2020-03-20 MED FILL — ESCITALOPRAM 10 MG TABLET: 10 | 90 days supply | Qty: 90 | Fill #2

## 2020-04-09 ENCOUNTER — Other Ambulatory Visit (HOSPITAL_BASED_OUTPATIENT_CLINIC_OR_DEPARTMENT_OTHER): Payer: Self-pay

## 2020-04-09 DIAGNOSIS — N8110 Cystocele, unspecified: Secondary | ICD-10-CM | POA: Diagnosis not present

## 2020-04-09 DIAGNOSIS — N958 Other specified menopausal and perimenopausal disorders: Secondary | ICD-10-CM | POA: Diagnosis not present

## 2020-04-10 MED FILL — ESTRADIOL 0.1 MG/GM CRM: 0.1 | 30 days supply | Qty: 43 | Fill #0

## 2020-05-23 DIAGNOSIS — N8110 Cystocele, unspecified: Secondary | ICD-10-CM | POA: Diagnosis not present

## 2020-05-23 DIAGNOSIS — N958 Other specified menopausal and perimenopausal disorders: Secondary | ICD-10-CM | POA: Diagnosis not present

## 2020-05-28 DIAGNOSIS — N8110 Cystocele, unspecified: Secondary | ICD-10-CM | POA: Insufficient documentation

## 2020-07-01 MED FILL — ESCITALOPRAM 10 MG TABLET: 10 | 90 days supply | Qty: 90 | Fill #3

## 2020-07-11 DIAGNOSIS — H0589 Other disorders of orbit: Secondary | ICD-10-CM | POA: Diagnosis not present

## 2020-07-11 DIAGNOSIS — Z85828 Personal history of other malignant neoplasm of skin: Secondary | ICD-10-CM | POA: Diagnosis not present

## 2020-07-11 DIAGNOSIS — L820 Inflamed seborrheic keratosis: Secondary | ICD-10-CM | POA: Diagnosis not present

## 2020-07-11 DIAGNOSIS — L821 Other seborrheic keratosis: Secondary | ICD-10-CM | POA: Diagnosis not present

## 2020-07-16 ENCOUNTER — Encounter: Payer: Self-pay | Admitting: Physician Assistant

## 2020-07-16 DIAGNOSIS — N8110 Cystocele, unspecified: Secondary | ICD-10-CM | POA: Diagnosis not present

## 2020-07-25 DIAGNOSIS — Z01812 Encounter for preprocedural laboratory examination: Secondary | ICD-10-CM | POA: Diagnosis not present

## 2020-07-25 DIAGNOSIS — Z20822 Contact with and (suspected) exposure to covid-19: Secondary | ICD-10-CM | POA: Diagnosis not present

## 2020-07-25 DIAGNOSIS — N8110 Cystocele, unspecified: Secondary | ICD-10-CM | POA: Diagnosis not present

## 2020-07-30 DIAGNOSIS — N816 Rectocele: Secondary | ICD-10-CM | POA: Diagnosis not present

## 2020-07-30 DIAGNOSIS — Z780 Asymptomatic menopausal state: Secondary | ICD-10-CM | POA: Diagnosis not present

## 2020-07-30 DIAGNOSIS — N812 Incomplete uterovaginal prolapse: Secondary | ICD-10-CM | POA: Diagnosis not present

## 2020-07-30 DIAGNOSIS — N8110 Cystocele, unspecified: Secondary | ICD-10-CM | POA: Diagnosis not present

## 2020-07-30 DIAGNOSIS — N895 Stricture and atresia of vagina: Secondary | ICD-10-CM | POA: Diagnosis not present

## 2020-07-30 DIAGNOSIS — N819 Female genital prolapse, unspecified: Secondary | ICD-10-CM | POA: Diagnosis not present

## 2020-07-30 MED FILL — ACETAMINOPHEN 325 MG TABS: 325 | 13 days supply | Qty: 100 | Fill #0

## 2020-07-30 MED FILL — traMADol HCL 50 MG TABS: 50 | 3 days supply | Qty: 15 | Fill #0

## 2020-07-30 MED FILL — OSCIMIN 0.125 MG SUBL: 0.125 | 5 days supply | Qty: 30 | Fill #0

## 2020-07-30 MED FILL — IBUPROFEN 600 MG TABLET: 600 | 7 days supply | Qty: 30 | Fill #0

## 2020-07-31 DIAGNOSIS — Z466 Encounter for fitting and adjustment of urinary device: Secondary | ICD-10-CM | POA: Diagnosis not present

## 2020-08-08 DIAGNOSIS — Z8742 Personal history of other diseases of the female genital tract: Secondary | ICD-10-CM | POA: Diagnosis not present

## 2020-08-13 ENCOUNTER — Telehealth: Payer: Self-pay | Admitting: Physician Assistant

## 2020-08-13 NOTE — Telephone Encounter (Signed)
Received fax for PA on Repatha sent through cover my meds waiting on determination. - CF 

## 2020-08-20 NOTE — Telephone Encounter (Signed)
I called patient to find out if she is still taking the [place]. Last filled a year ago.

## 2020-08-27 NOTE — Telephone Encounter (Signed)
Patient left vm stating she is no longer taking Repatha.

## 2020-09-12 DIAGNOSIS — Z8742 Personal history of other diseases of the female genital tract: Secondary | ICD-10-CM | POA: Diagnosis not present

## 2020-09-27 ENCOUNTER — Other Ambulatory Visit: Payer: Self-pay | Admitting: Physician Assistant

## 2020-09-27 DIAGNOSIS — Z1231 Encounter for screening mammogram for malignant neoplasm of breast: Secondary | ICD-10-CM

## 2020-11-05 DIAGNOSIS — H5201 Hypermetropia, right eye: Secondary | ICD-10-CM | POA: Diagnosis not present

## 2020-11-05 DIAGNOSIS — H5202 Hypermetropia, left eye: Secondary | ICD-10-CM | POA: Diagnosis not present

## 2020-11-05 DIAGNOSIS — H524 Presbyopia: Secondary | ICD-10-CM | POA: Diagnosis not present

## 2020-11-05 DIAGNOSIS — H52222 Regular astigmatism, left eye: Secondary | ICD-10-CM | POA: Diagnosis not present

## 2020-11-06 ENCOUNTER — Other Ambulatory Visit: Payer: Self-pay | Admitting: Physician Assistant

## 2020-11-06 DIAGNOSIS — F3340 Major depressive disorder, recurrent, in remission, unspecified: Secondary | ICD-10-CM

## 2020-11-06 MED FILL — ESCITALOPRAM 10 MG TABLET: 10 | 30 days supply | Qty: 30 | Fill #0

## 2020-11-07 ENCOUNTER — Other Ambulatory Visit: Payer: Self-pay | Admitting: Physician Assistant

## 2020-11-07 DIAGNOSIS — F3340 Major depressive disorder, recurrent, in remission, unspecified: Secondary | ICD-10-CM

## 2020-11-19 MED FILL — ESTRADIOL 0.1 MG/GM CREA: 0.1 | 30 days supply | Qty: 43 | Fill #1

## 2020-11-20 ENCOUNTER — Other Ambulatory Visit: Payer: Self-pay

## 2020-11-20 ENCOUNTER — Ambulatory Visit (INDEPENDENT_AMBULATORY_CARE_PROVIDER_SITE_OTHER): Payer: 59

## 2020-11-20 DIAGNOSIS — Z1231 Encounter for screening mammogram for malignant neoplasm of breast: Secondary | ICD-10-CM | POA: Diagnosis not present

## 2020-11-25 NOTE — Progress Notes (Signed)
Normal mammogram. Follow up in 1 year.

## 2020-12-10 ENCOUNTER — Encounter: Payer: 59 | Admitting: Physician Assistant

## 2020-12-18 ENCOUNTER — Other Ambulatory Visit: Payer: Self-pay

## 2020-12-18 ENCOUNTER — Other Ambulatory Visit: Payer: Self-pay | Admitting: Physician Assistant

## 2020-12-18 ENCOUNTER — Ambulatory Visit (INDEPENDENT_AMBULATORY_CARE_PROVIDER_SITE_OTHER): Payer: 59 | Admitting: Physician Assistant

## 2020-12-18 ENCOUNTER — Encounter: Payer: Self-pay | Admitting: Physician Assistant

## 2020-12-18 VITALS — BP 127/57 | HR 88 | Wt 155.0 lb

## 2020-12-18 DIAGNOSIS — Z0000 Encounter for general adult medical examination without abnormal findings: Secondary | ICD-10-CM

## 2020-12-18 DIAGNOSIS — J309 Allergic rhinitis, unspecified: Secondary | ICD-10-CM

## 2020-12-18 DIAGNOSIS — Z131 Encounter for screening for diabetes mellitus: Secondary | ICD-10-CM

## 2020-12-18 DIAGNOSIS — R5383 Other fatigue: Secondary | ICD-10-CM | POA: Diagnosis not present

## 2020-12-18 DIAGNOSIS — F3340 Major depressive disorder, recurrent, in remission, unspecified: Secondary | ICD-10-CM

## 2020-12-18 DIAGNOSIS — Z13220 Encounter for screening for lipoid disorders: Secondary | ICD-10-CM | POA: Diagnosis not present

## 2020-12-18 DIAGNOSIS — J4520 Mild intermittent asthma, uncomplicated: Secondary | ICD-10-CM | POA: Diagnosis not present

## 2020-12-18 MED ORDER — FLUTICASONE PROPIONATE 50 MCG/ACT NA SUSP: 2.0000 | Freq: Every day | NASAL | 4 refills | Status: DC

## 2020-12-18 MED ORDER — MONTELUKAST SODIUM 10 MG PO TABS: 10.0000 mg | ORAL_TABLET | Freq: Every day | ORAL | 3 refills | Status: DC

## 2020-12-18 MED ORDER — ESCITALOPRAM OXALATE 10 MG PO TABS: 10.0000 mg | ORAL_TABLET | Freq: Every day | ORAL | 4 refills | Status: DC

## 2020-12-18 NOTE — Progress Notes (Signed)
L Subjective:     Donna Ford is a 55 y.o. female and is here for a comprehensive physical exam. The patient reports see below.    She had surgery in august to repair her pelvic floor. She is feeling much better   Her stress level is still high. 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   Social History   Socioeconomic History   Marital status: Married    Spouse name: Not on file   Number of children: Not on file   Years of education: Not on file   Highest education level: Not on file  Occupational History   Not on file  Tobacco Use   Smoking status: Never Smoker   Smokeless tobacco: Never Used  Substance and Sexual Activity   Alcohol use: No    Alcohol/week: 0.0 standard drinks   Drug use: No   Sexual activity: Yes  Other Topics Concern   Not on file  Social History Narrative   Not on file   Social Determinants of Health   Financial Resource Strain: Not on file  Food Insecurity: Not on file  Transportation Needs: Not on file  Physical Activity: Not on file  Stress: Not on file  Social Connections: Not on file  Intimate Partner Violence: Not on file   Health Maintenance  Topic Date Due   MAMMOGRAM  11/20/2022   TETANUS/TDAP  12/08/2023   COLONOSCOPY (Pts 45-32yrs Insurance coverage will need to be confirmed)  05/14/2025   INFLUENZA VACCINE  Completed   Hepatitis C Screening  Completed   HIV Screening  Completed    The following portions of the patient's history were reviewed and updated as appropriate: allergies, current medications, past family history, past medical history, past social history, past surgical history and problem list.  Review of Systems Pertinent items noted in HPI and remainder of comprehensive ROS otherwise negative.   Objective:    BP (!) 127/57    Pulse 88    Wt 155 lb (70.3 kg)    SpO2 96%    BMI 28.81 kg/m  General appearance: alert, cooperative and appears stated age Head: Normocephalic, without  obvious abnormality, atraumatic Eyes: conjunctivae/corneas clear. PERRL, EOM's intact. Fundi benign. Ears: normal TM's and external ear canals both ears Nose: Nares normal. Septum midline. Mucosa normal. No drainage or sinus tenderness. Throat: lips, mucosa, and tongue normal; teeth and gums normal Neck: no adenopathy, no carotid bruit, no JVD, supple, symmetrical, trachea midline and thyroid not enlarged, symmetric, no tenderness/mass/nodules Back: symmetric, no curvature. ROM normal. No CVA tenderness. Lungs: clear to auscultation bilaterally Heart: regular rate and rhythm, S1, S2 normal, no murmur, click, rub or gallop Abdomen: soft, non-tender; bowel sounds normal; no masses,  no organomegaly Extremities: extremities normal, atraumatic, no cyanosis or edema Pulses: 2+ and symmetric Skin: Skin color, texture, turgor normal. No rashes or lesions Lymph nodes: Cervical, supraclavicular, and axillary nodes normal. Neurologic: Alert and oriented X 3, normal strength and tone. Normal symmetric reflexes. Normal coordination and gait   .Marland Kitchen Depression screen [facility] 2/9 12/18/2020 08/23/2019 05/11/2018 05/12/2017  Decreased Interest 0 0 0 0  Down, Depressed, Hopeless 0 0 0 0  PHQ - 2 Score 0 0 0 0  Altered sleeping 0 0 - 0  Tired, decreased energy 0 0 - 0  Change in appetite 0 0 - 0  Feeling bad or failure about yourself  0 0 - 0  Trouble concentrating 0 0 - 0  Moving slowly or  fidgety/restless 0 0 - 0  Suicidal thoughts 0 0 - 0  PHQ-9 Score 0 0 - 0  Difficult doing work/chores - Not difficult at all - -   .Marland Kitchen GAD 7 : Generalized Anxiety Score 12/18/2020 08/23/2019 05/12/2017  Nervous, Anxious, on Edge 0 0 0  Control/stop worrying 0 0 0  Worry too much - different things 0 0 0  Trouble relaxing 0 0 0  Restless 0 0 0  Easily annoyed or irritable 0 0 0  Afraid - awful might happen 0 0 0  Total GAD 7 Score 0 0 0  Anxiety Difficulty - Not difficult at all Not difficult at all     Assessment:     Healthy female exam.      Plan:  Marland KitchenMarland KitchenDiagnoses and all orders for this visit:  Routine physical examination -     Lipid Panel w/reflex Direct LDL -     COMPLETE METABOLIC PANEL WITH GFR -     Hemoglobin A1c -     VITAMIN D 25 Hydroxy (Vit-D Deficiency, Fractures) -     B12 and Folate Panel -     TSH  Recurrent major depressive disorder, in remission ([facility]) -     escitalopram (LEXAPRO) 10 MG tablet; Take 1 tablet (10 mg total) by mouth daily.  No energy -     VITAMIN D 25 Hydroxy (Vit-D Deficiency, Fractures) -     B12 and Folate Panel -     TSH  Screening for diabetes mellitus -     COMPLETE METABOLIC PANEL WITH GFR -     Hemoglobin A1c  Screening for lipid disorders -     Lipid Panel w/reflex Direct LDL  Allergic rhinitis, unspecified seasonality, unspecified trigger  Mild intermittent reactive airway disease without complication -     montelukast (SINGULAIR) 10 MG tablet; Take 1 tablet (10 mg total) by mouth at bedtime. -     fluticasone (FLONASE) 50 MCG/ACT nasal spray; Place 2 sprays into both nostrils daily.   .. Discussed 150 minutes of exercise a week.  Encouraged vitamin D 1000 units and Calcium 1300mg  or 4 servings of dairy a day.  Fasting labs ordered. PHQ and GAD stable.  Mammogram UTD. No need for pap due to hysterectomy. meds refilled.  Colonoscopy UTD.  covid and flu UTD.  Declined shingles but discussed in office.      See After Visit Summary for Counseling Recommendations

## 2020-12-18 NOTE — Patient Instructions (Signed)
Health Maintenance, Female Adopting a healthy lifestyle and getting preventive care are important in promoting health and wellness. Ask your health care provider about:  The right schedule for you to have regular tests and exams.  Things you can do on your own to prevent diseases and keep yourself healthy. What should I know about diet, weight, and exercise? Eat a healthy diet  Eat a diet that includes plenty of vegetables, fruits, low-fat dairy products, and lean protein.  Do not eat a lot of foods that are high in solid fats, added sugars, or sodium.   Maintain a healthy weight Body mass index (BMI) is used to identify weight problems. It estimates body fat based on height and weight. Your health care provider can help determine your BMI and help you achieve or maintain a healthy weight. Get regular exercise Get regular exercise. This is one of the most important things you can do for your health. Most adults should:  Exercise for at least 150 minutes each week. The exercise should increase your heart rate and make you sweat (moderate-intensity exercise).  Do strengthening exercises at least twice a week. This is in addition to the moderate-intensity exercise.  Spend less time sitting. Even light physical activity can be beneficial. Watch cholesterol and blood lipids Have your blood tested for lipids and cholesterol at 57 years of age, then have this test every 5 years. Have your cholesterol levels checked more often if:  Your lipid or cholesterol levels are high.  You are older than 57 years of age.  You are at high risk for heart disease. What should I know about cancer screening? Depending on your health history and family history, you may need to have cancer screening at various ages. This may include screening for:  Breast cancer.  Cervical cancer.  Colorectal cancer.  Skin cancer.  Lung cancer. What should I know about heart disease, diabetes, and high blood  pressure? Blood pressure and heart disease  High blood pressure causes heart disease and increases the risk of stroke. This is more likely to develop in people who have high blood pressure readings, are of African descent, or are overweight.  Have your blood pressure checked: ? Every 3-5 years if you are 18-39 years of age. ? Every year if you are 40 years old or older. Diabetes Have regular diabetes screenings. This checks your fasting blood sugar level. Have the screening done:  Once every three years after age 40 if you are at a normal weight and have a low risk for diabetes.  More often and at a younger age if you are overweight or have a high risk for diabetes. What should I know about preventing infection? Hepatitis B If you have a higher risk for hepatitis B, you should be screened for this virus. Talk with your health care provider to find out if you are at risk for hepatitis B infection. Hepatitis C Testing is recommended for:  Everyone born from 1945 through 1965.  Anyone with known risk factors for hepatitis C. Sexually transmitted infections (STIs)  Get screened for STIs, including gonorrhea and chlamydia, if: ? You are sexually active and are younger than 57 years of age. ? You are older than 57 years of age and your health care provider tells you that you are at risk for this type of infection. ? Your sexual activity has changed since you were last screened, and you are at increased risk for chlamydia or gonorrhea. Ask your health care provider   if you are at risk.  Ask your health care provider about whether you are at high risk for HIV. Your health care provider may recommend a prescription medicine to help prevent HIV infection. If you choose to take medicine to prevent HIV, you should first get tested for HIV. You should then be tested every 3 months for as long as you are taking the medicine. Pregnancy  If you are about to stop having your period (premenopausal) and  you may become pregnant, seek counseling before you get pregnant.  Take 400 to 800 micrograms (mcg) of folic acid every day if you become pregnant.  Ask for birth control (contraception) if you want to prevent pregnancy. Osteoporosis and menopause Osteoporosis is a disease in which the bones lose minerals and strength with aging. This can result in bone fractures. If you are 65 years old or older, or if you are at risk for osteoporosis and fractures, ask your health care provider if you should:  Be screened for bone loss.  Take a calcium or vitamin D supplement to lower your risk of fractures.  Be given hormone replacement therapy (HRT) to treat symptoms of menopause. Follow these instructions at home: Lifestyle  Do not use any products that contain nicotine or tobacco, such as cigarettes, e-cigarettes, and chewing tobacco. If you need help quitting, ask your health care provider.  Do not use street drugs.  Do not share needles.  Ask your health care provider for help if you need support or information about quitting drugs. Alcohol use  Do not drink alcohol if: ? Your health care provider tells you not to drink. ? You are pregnant, may be pregnant, or are planning to become pregnant.  If you drink alcohol: ? Limit how much you use to 0-1 drink a day. ? Limit intake if you are breastfeeding.  Be aware of how much alcohol is in your drink. In the [place] one drink equals one 12 oz bottle of beer (355 mL), one 5 oz glass of wine (148 mL), or one 1 oz glass of hard liquor (44 mL). General instructions  Schedule regular health, dental, and eye exams.  Stay current with your vaccines.  Tell your health care provider if: ? You often feel depressed. ? You have ever been abused or do not feel safe at home. Summary  Adopting a healthy lifestyle and getting preventive care are important in promoting health and wellness.  Follow your health care provider's instructions about healthy  diet, exercising, and getting tested or screened for diseases.  Follow your health care provider's instructions on monitoring your cholesterol and blood pressure. This information is not intended to replace advice given to you by your health care provider. Make sure you discuss any questions you have with your health care provider. Document Revised: 11/16/2018 Document Reviewed: 11/16/2018 Elsevier Patient Education  2021 Elsevier Inc.  

## 2020-12-19 ENCOUNTER — Other Ambulatory Visit (HOSPITAL_BASED_OUTPATIENT_CLINIC_OR_DEPARTMENT_OTHER): Payer: Self-pay | Admitting: Physician Assistant

## 2020-12-19 MED FILL — MONTELUKAST SOD 10 MG TAB: 10 | 90 days supply | Qty: 90 | Fill #0

## 2020-12-19 MED FILL — FLUTICASONE PROP 50 MCG SPR: 50 | 30 days supply | Qty: 16 | Fill #0

## 2020-12-19 MED FILL — ESCITALOPRAM 10 MG TABLET: 10 | 90 days supply | Qty: 90 | Fill #0

## 2020-12-20 ENCOUNTER — Encounter: Payer: Self-pay | Admitting: Physician Assistant

## 2020-12-20 DIAGNOSIS — R5383 Other fatigue: Secondary | ICD-10-CM | POA: Insufficient documentation

## 2020-12-31 ENCOUNTER — Telehealth (INDEPENDENT_AMBULATORY_CARE_PROVIDER_SITE_OTHER): Payer: 59 | Admitting: Medical-Surgical

## 2020-12-31 ENCOUNTER — Encounter: Payer: Self-pay | Admitting: Medical-Surgical

## 2020-12-31 ENCOUNTER — Other Ambulatory Visit: Payer: Self-pay | Admitting: Medical-Surgical

## 2020-12-31 VITALS — Temp 97.5°F

## 2020-12-31 DIAGNOSIS — J069 Acute upper respiratory infection, unspecified: Secondary | ICD-10-CM | POA: Diagnosis not present

## 2020-12-31 MED ORDER — PROMETHAZINE-DM 6.25-15 MG/5ML PO SYRP: 5.0000 mL | ORAL_SOLUTION | Freq: Two times a day (BID) | ORAL | 0 refills | Status: DC | PRN

## 2020-12-31 MED ORDER — BENZONATATE 200 MG PO CAPS: 200.0000 mg | ORAL_CAPSULE | Freq: Three times a day (TID) | ORAL | 0 refills | Status: DC | PRN

## 2020-12-31 MED FILL — BENZONATATE 200 MG CAPS: 200 | 10 days supply | Qty: 30 | Fill #0

## 2020-12-31 MED FILL — PROMETHAZINE W/DM SYRUP: 6.25-15 | 12 days supply | Qty: 118 | Fill #0

## 2020-12-31 NOTE — Progress Notes (Signed)
Virtual Visit via Video Note  I connected with Donna Ford on 12/31/20 at  9:10 AM EST by a video enabled telemedicine application and verified that I am speaking with the correct person using two identifiers.   I discussed the limitations of evaluation and management by telemedicine and the availability of in person appointments. The patient expressed understanding and agreed to proceed.  Patient location: home Provider locations: office  Subjective:    CC: viral symptoms  HPI: Pleasant 55 year old female presenting via MyChart video visit with reports of 3 days of upper respiratory symptoms including chills, nasal congestion, cough, and chest congestion. Notes mild SOB with activity. States that she doesn't feel the greatest but also is not feeling horrible. Originally thought her symptoms were related to her typical allergies/sinus flares. Works in [employer] and went to work yesterday but woke up this morning with her cough worsened and called health at work. Will be testing for COVID this morning. Slept ok last night for the most part. No body aches, fevers, chest pain, or GI symptoms. Taking Mucinex-DM with some relief.   Past medical history, Surgical history, Family history not pertinant except as noted below, Social history, Allergies, and medications have been entered into the medical record, reviewed, and corrections made.   Review of Systems: See HPI for pertinent positives and negatives.   Objective:    General: Speaking clearly in complete sentences without any shortness of breath.  Alert and oriented x3.  Normal judgment. No apparent acute distress.  Impression and Recommendations:    1. Viral upper respiratory tract infection Symptoms consistent with COVID-19 infection.  Continue with plan to get tested per health at work instruction.  Since her symptoms are mild at this point, we will treat with Tessalon Perles for cough during the day and Promethazine DM for nighttime  use.  If her symptoms continue to worsen or have not begun improving in the next 2 to 3 days, consider adding in a 5-day burst of Decadron.  Advised patient to let us know what her COVID-19 test results are so we can document this in the chart.  I discussed the assessment and treatment plan with the patient. The patient was provided an opportunity to ask questions and all were answered. The patient agreed with the plan and demonstrated an understanding of the instructions.   The patient was advised to call back or seek an in-person evaluation if the symptoms worsen or if the condition fails to improve as anticipated.  20 minutes of non-face-to-face time was provided during this encounter.  Return if symptoms worsen or fail to improve.  Clearnce Sorrel, DNP, APRN, FNP-BC [facility]

## 2021-01-01 ENCOUNTER — Encounter: Payer: Self-pay | Admitting: Medical-Surgical

## 2021-01-01 ENCOUNTER — Other Ambulatory Visit: Payer: Self-pay | Admitting: Medical-Surgical

## 2021-01-01 MED ORDER — DEXAMETHASONE 6 MG PO TABS: 6.0000 mg | ORAL_TABLET | Freq: Two times a day (BID) | ORAL | 0 refills | Status: DC

## 2021-01-02 MED FILL — DEXAMETHASONE 6 MG TABS: 6 | 5 days supply | Qty: 10 | Fill #0

## 2021-01-08 DIAGNOSIS — Z13220 Encounter for screening for lipoid disorders: Secondary | ICD-10-CM | POA: Diagnosis not present

## 2021-01-08 DIAGNOSIS — Z131 Encounter for screening for diabetes mellitus: Secondary | ICD-10-CM | POA: Diagnosis not present

## 2021-01-08 DIAGNOSIS — Z0000 Encounter for general adult medical examination without abnormal findings: Secondary | ICD-10-CM | POA: Diagnosis not present

## 2021-01-08 DIAGNOSIS — R5383 Other fatigue: Secondary | ICD-10-CM | POA: Diagnosis not present

## 2021-01-09 LAB — B12 AND FOLATE PANEL
Folate: 9.3 ng/mL
Vitamin B-12: 392 pg/mL (ref 200–1100)

## 2021-01-09 LAB — TSH: TSH: 0.39 mIU/L — ABNORMAL LOW (ref 0.40–4.50)

## 2021-01-09 LAB — COMPLETE METABOLIC PANEL WITH GFR
AG Ratio: 2.1 (calc) (ref 1.0–2.5)
ALT: 18 U/L (ref 6–29)
AST: 11 U/L (ref 10–35)
Albumin: 4.4 g/dL (ref 3.6–5.1)
Alkaline phosphatase (APISO): 58 U/L (ref 37–153)
BUN: 16 mg/dL (ref 7–25)
CO2: 26 mmol/L (ref 20–32)
Calcium: 9.5 mg/dL (ref 8.6–10.4)
Chloride: 103 mmol/L (ref 98–110)
Creat: 0.67 mg/dL (ref 0.50–1.05)
GFR, Est African American: 114 mL/min/{1.73_m2} (ref >=60)
GFR, Est Non African American: 98 mL/min/{1.73_m2} (ref >=60)
Globulin: 2.1 g/dL (calc) (ref 1.9–3.7)
Glucose, Bld: 98 mg/dL (ref 65–99)
Potassium: 4.5 mmol/L (ref 3.5–5.3)
Sodium: 138 mmol/L (ref 135–146)
Total Bilirubin: 0.4 mg/dL (ref 0.2–1.2)
Total Protein: 6.5 g/dL (ref 6.1–8.1)

## 2021-01-09 LAB — LIPID PANEL W/REFLEX DIRECT LDL
Cholesterol: 354 mg/dL — ABNORMAL HIGH (ref <200)
HDL: 60 mg/dL (ref >=50)
LDL Cholesterol (Calc): 265 mg/dL (calc) — ABNORMAL HIGH
Non-HDL Cholesterol (Calc): 294 mg/dL (calc) — ABNORMAL HIGH (ref <130)
Total CHOL/HDL Ratio: 5.9 (calc) — ABNORMAL HIGH (ref <5.0)
Triglycerides: 140 mg/dL (ref <150)

## 2021-01-09 LAB — HEMOGLOBIN A1C
Hgb A1c MFr Bld: 5.9 % of total Hgb — ABNORMAL HIGH (ref <5.7)
Mean Plasma Glucose: 123 mg/dL
eAG (mmol/L): 6.8 mmol/L

## 2021-01-09 LAB — VITAMIN D 25 HYDROXY (VIT D DEFICIENCY, FRACTURES): Vit D, 25-Hydroxy: 27 ng/mL — ABNORMAL LOW (ref 30–100)

## 2021-01-09 NOTE — Progress Notes (Signed)
Donna Ford,   Your HDL is better so some of your lifestyle changes are helping that but your LDL is still very elevated. We need to do something. I know you tried lipitor. Would you try one more statin and see if you tolerated better? If not we need to see if we can get the PsK-9 shots like repatha approved. At an LDL like that your risk of CV event is very elevated.   Kidney and liver look great.   Vitamin D is low. What are you taking right now? You need to be on at least 2000 units daily.  Vitamin B12 normal range could be a little better. Start 1078mcg daily.   Your A1C is 5.9 and in pre-diabetes range. Low carb and sugar. If you wanted to start early medication intervention I might could get approved the once weekly shot that helps with sugars and weight loss. Thoughts?   Your TSH is a little low meaning right now thyroid hormones are a little too active/high. Before we act on this we need to recheck in about 3 weeks labs only.

## 2021-03-28 ENCOUNTER — Other Ambulatory Visit (HOSPITAL_BASED_OUTPATIENT_CLINIC_OR_DEPARTMENT_OTHER): Payer: Self-pay

## 2021-03-28 MED FILL — Escitalopram Oxalate Tab 10 MG (Base Equiv): ORAL | 90 days supply | Qty: 90 | Fill #0 | Status: AC

## 2021-04-04 ENCOUNTER — Other Ambulatory Visit (HOSPITAL_BASED_OUTPATIENT_CLINIC_OR_DEPARTMENT_OTHER): Payer: Self-pay

## 2021-04-04 MED FILL — Montelukast Sodium Tab 10 MG (Base Equiv): ORAL | 90 days supply | Qty: 90 | Fill #0 | Status: CN

## 2021-04-11 ENCOUNTER — Other Ambulatory Visit (HOSPITAL_BASED_OUTPATIENT_CLINIC_OR_DEPARTMENT_OTHER): Payer: Self-pay

## 2021-04-22 DIAGNOSIS — L814 Other melanin hyperpigmentation: Secondary | ICD-10-CM | POA: Diagnosis not present

## 2021-04-22 DIAGNOSIS — X32XXXS Exposure to sunlight, sequela: Secondary | ICD-10-CM | POA: Diagnosis not present

## 2021-04-24 ENCOUNTER — Other Ambulatory Visit (HOSPITAL_COMMUNITY): Payer: Self-pay

## 2021-04-24 MED FILL — Montelukast Sodium Tab 10 MG (Base Equiv): ORAL | 90 days supply | Qty: 90 | Fill #0 | Status: AC

## 2021-07-23 ENCOUNTER — Other Ambulatory Visit (HOSPITAL_COMMUNITY): Payer: Self-pay

## 2021-07-23 MED FILL — Escitalopram Oxalate Tab 10 MG (Base Equiv): ORAL | 90 days supply | Qty: 90 | Fill #0 | Status: AC

## 2021-09-01 ENCOUNTER — Other Ambulatory Visit (HOSPITAL_COMMUNITY): Payer: Self-pay

## 2021-09-01 MED FILL — Fluticasone Propionate Nasal Susp 50 MCG/ACT: NASAL | 30 days supply | Qty: 16 | Fill #0 | Status: AC

## 2021-09-03 ENCOUNTER — Telehealth: Payer: Self-pay

## 2021-09-03 NOTE — Telephone Encounter (Signed)
Medication: Evolocumab (REPATHA SURECLICK) 004 MG/ML Solution Auto-injector PA not filed because this pt is not currently on this medication. It was last written 08/23/2019

## 2021-09-19 ENCOUNTER — Other Ambulatory Visit (HOSPITAL_COMMUNITY): Payer: Self-pay

## 2021-09-19 MED FILL — Montelukast Sodium Tab 10 MG (Base Equiv): ORAL | 90 days supply | Qty: 90 | Fill #1 | Status: AC

## 2021-10-07 ENCOUNTER — Other Ambulatory Visit: Payer: Self-pay | Admitting: Physician Assistant

## 2021-10-07 DIAGNOSIS — Z1231 Encounter for screening mammogram for malignant neoplasm of breast: Secondary | ICD-10-CM

## 2021-10-17 DIAGNOSIS — L814 Other melanin hyperpigmentation: Secondary | ICD-10-CM | POA: Diagnosis not present

## 2021-10-17 DIAGNOSIS — L820 Inflamed seborrheic keratosis: Secondary | ICD-10-CM | POA: Diagnosis not present

## 2021-10-17 DIAGNOSIS — D2372 Other benign neoplasm of skin of left lower limb, including hip: Secondary | ICD-10-CM | POA: Diagnosis not present

## 2021-10-17 DIAGNOSIS — L570 Actinic keratosis: Secondary | ICD-10-CM | POA: Diagnosis not present

## 2021-10-17 DIAGNOSIS — D485 Neoplasm of uncertain behavior of skin: Secondary | ICD-10-CM | POA: Diagnosis not present

## 2021-11-12 DIAGNOSIS — H524 Presbyopia: Secondary | ICD-10-CM | POA: Diagnosis not present

## 2021-11-12 DIAGNOSIS — H2513 Age-related nuclear cataract, bilateral: Secondary | ICD-10-CM | POA: Diagnosis not present

## 2021-11-14 ENCOUNTER — Other Ambulatory Visit (HOSPITAL_BASED_OUTPATIENT_CLINIC_OR_DEPARTMENT_OTHER): Payer: Self-pay

## 2021-11-14 MED FILL — Escitalopram Oxalate Tab 10 MG (Base Equiv): ORAL | 90 days supply | Qty: 90 | Fill #1 | Status: AC

## 2021-11-14 MED FILL — Fluticasone Propionate Nasal Susp 50 MCG/ACT: NASAL | 30 days supply | Qty: 16 | Fill #1 | Status: AC

## 2021-11-26 ENCOUNTER — Ambulatory Visit: Payer: 59

## 2021-12-03 ENCOUNTER — Ambulatory Visit (INDEPENDENT_AMBULATORY_CARE_PROVIDER_SITE_OTHER): Payer: 59

## 2021-12-03 ENCOUNTER — Other Ambulatory Visit: Payer: Self-pay

## 2021-12-03 DIAGNOSIS — Z1231 Encounter for screening mammogram for malignant neoplasm of breast: Secondary | ICD-10-CM | POA: Diagnosis not present

## 2021-12-04 NOTE — Progress Notes (Signed)
Normal mammogram. Follow up in one year.

## 2022-01-09 ENCOUNTER — Ambulatory Visit (INDEPENDENT_AMBULATORY_CARE_PROVIDER_SITE_OTHER): Payer: 59

## 2022-01-09 ENCOUNTER — Other Ambulatory Visit (HOSPITAL_BASED_OUTPATIENT_CLINIC_OR_DEPARTMENT_OTHER): Payer: Self-pay

## 2022-01-09 ENCOUNTER — Other Ambulatory Visit (HOSPITAL_COMMUNITY): Payer: Self-pay

## 2022-01-09 ENCOUNTER — Other Ambulatory Visit: Payer: Self-pay

## 2022-01-09 ENCOUNTER — Ambulatory Visit: Payer: 59 | Admitting: Sports Medicine

## 2022-01-09 DIAGNOSIS — M533 Sacrococcygeal disorders, not elsewhere classified: Secondary | ICD-10-CM | POA: Diagnosis not present

## 2022-01-09 DIAGNOSIS — M545 Low back pain, unspecified: Secondary | ICD-10-CM | POA: Diagnosis not present

## 2022-01-09 DIAGNOSIS — M5136 Other intervertebral disc degeneration, lumbar region: Secondary | ICD-10-CM | POA: Diagnosis not present

## 2022-01-09 MED ORDER — KETOROLAC TROMETHAMINE 30 MG/ML IJ SOLN
30.0000 mg | Freq: Once | INTRAMUSCULAR | Status: AC
  Administered 2022-01-09: 30 mg via INTRAMUSCULAR

## 2022-01-09 MED ORDER — TRAMADOL HCL 50 MG PO TABS
50.0000 mg | ORAL_TABLET | Freq: Three times a day (TID) | ORAL | 0 refills | Status: DC | PRN
Start: 2022-01-09 — End: 2022-02-12
  Filled 2022-01-09: qty 21, 7d supply, fill #0

## 2022-01-09 MED ORDER — PREDNISONE 50 MG PO TABS
ORAL_TABLET | ORAL | 0 refills | Status: DC
  Filled 2022-01-09: qty 5, 5d supply, fill #0

## 2022-01-09 NOTE — Progress Notes (Signed)
° ° °  Procedures performed today:    Toradol 30 given intramuscular today.  Independent interpretation of notes and tests performed by another provider:   None.  Brief History, Exam, Impression, and Recommendations:    Acute right-sided low back pain without sciatica Marshall is a very pleasant 56 year old female, she recently bent over to pick something up and had increasing pain in her right side low back, worse with standing, better with sitting, flexion, nothing overtly radicular, no red flag symptoms. On exam she has severe tenderness over her right sacroiliac joint. She is in a lot of pain today so we will do Toradol 30 intramuscular. Adding x-rays of her lumbar spine and SI joints, 5 days of prednisone, tramadol, she has over-the-counter ibuprofen that she will take at 800 mg 3 times daily. Home conditioning given. Return to see me in 4 weeks, SI joint injection if not better.    ___________________________________________ Gwen Her. Dianah Field, M.D., ABFM., CAQSM. Primary Care and [place] Instructor of [place] [facility]

## 2022-01-09 NOTE — Addendum Note (Signed)
Addended by: Dema Severin on: 01/09/2022 04:36 PM   Modules accepted: Orders

## 2022-01-09 NOTE — Assessment & Plan Note (Signed)
Donna Ford is a very pleasant 56 year old female, she recently bent over to pick something up and had increasing pain in her right side low back, worse with standing, better with sitting, flexion, nothing overtly radicular, no red flag symptoms. On exam she has severe tenderness over her right sacroiliac joint. She is in a lot of pain today so we will do Toradol 30 intramuscular. Adding x-rays of her lumbar spine and SI joints, 5 days of prednisone, tramadol, she has over-the-counter ibuprofen that she will take at 800 mg 3 times daily. Home conditioning given. Return to see me in 4 weeks, SI joint injection if not better.

## 2022-01-22 ENCOUNTER — Other Ambulatory Visit: Payer: Self-pay | Admitting: Physician Assistant

## 2022-01-22 ENCOUNTER — Other Ambulatory Visit (HOSPITAL_BASED_OUTPATIENT_CLINIC_OR_DEPARTMENT_OTHER): Payer: Self-pay

## 2022-01-22 DIAGNOSIS — J4520 Mild intermittent asthma, uncomplicated: Secondary | ICD-10-CM

## 2022-01-22 MED ORDER — FLUTICASONE PROPIONATE 50 MCG/ACT NA SUSP
2.0000 | Freq: Every day | NASAL | 4 refills | Status: DC
  Filled 2022-01-22: qty 16, 30d supply, fill #0
  Filled 2022-04-08: qty 16, 30d supply, fill #1
  Filled 2022-06-17: qty 16, 30d supply, fill #2
  Filled 2022-08-17: qty 16, 30d supply, fill #3
  Filled 2022-10-24: qty 16, 30d supply, fill #4

## 2022-02-11 ENCOUNTER — Ambulatory Visit: Payer: 59 | Admitting: Sports Medicine

## 2022-02-12 ENCOUNTER — Emergency Department
Admission: EM | Admit: 2022-02-12 | Discharge: 2022-02-12 | Disposition: A | Discharge status: Home / Self Care | Payer: 59 | Source: Home / Self Care

## 2022-02-12 ENCOUNTER — Other Ambulatory Visit: Payer: Self-pay

## 2022-02-12 ENCOUNTER — Other Ambulatory Visit (HOSPITAL_BASED_OUTPATIENT_CLINIC_OR_DEPARTMENT_OTHER): Payer: Self-pay

## 2022-02-12 DIAGNOSIS — M659 Synovitis and tenosynovitis, unspecified: Secondary | ICD-10-CM

## 2022-02-12 MED ORDER — CEFADROXIL 500 MG PO CAPS
500.0000 mg | ORAL_CAPSULE | Freq: Two times a day (BID) | ORAL | 0 refills | Status: DC
  Filled 2022-02-12: qty 14, 7d supply, fill #0

## 2022-02-12 MED ORDER — METHYLPREDNISOLONE 4 MG PO TBPK
ORAL_TABLET | ORAL | 0 refills | Status: DC
  Filled 2022-02-12: qty 21, 6d supply, fill #0

## 2022-02-12 NOTE — ED Provider Notes (Signed)
?[name] ? ? ? ?CSN: [id number] ?Arrival date & time: 02/12/22  1223 ? ? ?  ? ?History   ?Chief Complaint ?Chief Complaint  ?Patient presents with  ? hand swelling  ? ? ?HPI ?Donna Ford is a 56 y.o. female.  ? ?HPI ? ?Patient has some scratches on her hand that are superficial and she did not think were of any consequence.  He states that the most recent scratch happened Monday.  Over the last 2 days she has had increasing pain redness and swelling along on the dorsum of her left hand.  She states that she did spend some time helping with yard work at her mother's, including spreading a lot of pine needles. ?She works as an Therapist, sports. ?She has had no fever or chills.  No malaise. ?No pain at rest, but her hand is slightly swollen and she has a diminished ability to grip and flex her fingers ? ?History reviewed. No pertinent past medical history. ? ?Patient Active Problem List  ? Diagnosis Date Noted  ? Acute right-sided low back pain without sciatica 01/09/2022  ? No energy 12/20/2020  ? Dyslipidemia 05/12/2018  ? Reactive airway disease 04/12/2018  ? Cough 04/07/2018  ? SOB (shortness of breath) 04/07/2018  ? Recurrent major depressive disorder, in remission ([place]) 05/12/2017  ? Trigger finger of both hands 05/12/2017  ? Hyperlipemia 10/10/2015  ? Rhinitis, allergic 08/07/2015  ? Post menopausal problems 08/07/2015  ? Nasal congestion 08/07/2015  ? Rectal pain 10/22/2014  ? Esophageal reflux 10/19/2014  ? External hemorrhoid 10/19/2014  ? ? ?Past Surgical History:  ?Procedure Laterality Date  ? ABDOMINAL HYSTERECTOMY    ? ? ?OB History   ?No obstetric history on file. ?  ? ? ? ?Home Medications   ? ?Prior to Admission medications   ?Medication Sig Start Date End Date Taking? Authorizing Provider  ?cefadroxil (DURICEF) 500 MG capsule Take 1 capsule (500 mg total) by mouth 2 (two) times daily. 02/12/22  Yes Raylene Everts, MD  ?methylPREDNISolone (MEDROL DOSEPAK) 4 MG TBPK tablet Take 6 tablets by mouth on  day 1, then take 5 tablets on day 2, then 4 tablets on day 3, then 3 tablets on day 4, then 2 tablets on day 5, then 1 tablet on day 6 02/12/22  Yes Raylene Everts, MD  ?escitalopram (LEXAPRO) 10 MG tablet Take 1 tablet (10 mg total) by mouth daily. 12/18/20   Donella Stade, PA-C  ?estradiol (ESTRACE) 0.1 MG/GM vaginal cream Place vaginally every other day. 11/19/20   [provider]  ?fluticasone (FLONASE) 50 MCG/ACT nasal spray PLACE 2 SPRAYS INTO BOTH NOSTRILS DAILY. 01/22/22 01/22/23  Donella Stade, PA-C  ?montelukast (SINGULAIR) 10 MG tablet TAKE 1 TABLET BY MOUTH AT BEDTIME 12/18/20 12/18/21  Donella Stade, PA-C  ? ? ?Family History ?Family History  ?Problem Relation Age of Onset  ? Heart attack Father 16  ?     open heart at 60  ? Hypertension Father   ? ? ?Social History ?Social History  ? ?Tobacco Use  ? Smoking status: Never  ? Smokeless tobacco: Never  ?Substance Use Topics  ? Alcohol use: No  ?  Alcohol/week: 0.0 standard drinks  ? Drug use: No  ? ? ? ?Allergies   ?Atorvastatin ? ? ?Review of Systems ?Review of Systems ? ?See HPI ?Physical Exam ?Triage Vital Signs ?ED Triage Vitals  ?Enc Vitals Group  ?   BP 02/12/22 1235 138/77  ?  Pulse Rate 02/12/22 1235 85  ?   Resp 02/12/22 1235 14  ?   Temp 02/12/22 1235 98.2 ?F (36.8 ?C)  ?   Temp Source 02/12/22 1235 Oral  ?   SpO2 02/12/22 1235 97 %  ?   Weight 02/12/22 1237 146 lb (66.2 kg)  ?   Height --   ?   Head Circumference --   ?   Peak Flow --   ?   Pain Score 02/12/22 1237 1  ?   Pain Loc --   ?   Pain Edu? --   ?   Excl. in [place]? --   ? ?No data found. ? ?Updated Vital Signs ?BP 138/77 (BP Location: Left Arm)   Pulse 85   Temp 98.2 ?F (36.8 ?C) (Oral)   Resp 14   Wt 66.2 kg   SpO2 97%   BMI 27.14 kg/m?  ? ?  ?Physical Exam ?Constitutional:   ?   General: She is not in acute distress. ?   Appearance: Normal appearance. She is well-developed.  ?HENT:  ?   Head: Normocephalic and atraumatic.  ?   Mouth/Throat:  ?   Comments: Mask in  place ?Eyes:  ?   Conjunctiva/sclera: Conjunctivae normal.  ?   Pupils: Pupils are equal, round, and reactive to light.  ?Cardiovascular:  ?   Rate and Rhythm: Normal rate.  ?Pulmonary:  ?   Effort: Pulmonary effort is normal. No respiratory distress.  ?Abdominal:  ?   General: There is no distension.  ?   Palpations: Abdomen is soft.  ?Musculoskeletal:     ?   General: Normal range of motion.  ?     Arms: ? ?   Cervical back: Normal range of motion.  ?   Comments: Small superficial abrasion is seen on the left wrist centrally over the carpal bone.  It is about a centimeter long.  The dorsum of the hand has some erythema.  Tenderness.  Patient states that she is unable to fully flex her fingers.  This is localized over the extensor tendon compartment of the hand.  It is nontender to palpation  ?Skin: ?   General: Skin is warm and dry.  ?Neurological:  ?   Mental Status: She is alert.  ? ? ? ?UC Treatments / Results  ?Labs ?(all labs ordered are listed, but only abnormal results are displayed) ?Labs Reviewed - No data to display ? ?EKG ? ? ?Radiology ?No results found. ? ?Procedures ?Procedures (including critical care time) ? ?Medications Ordered in UC ?Medications - No data to display ? ?Initial Impression / Assessment and Plan / UC Course  ?I have reviewed the triage vital signs and the nursing notes. ? ?Pertinent labs & imaging results that were available during my care of the patient were reviewed by me and considered in my medical decision making (see chart for details). ? ?  ? ?I discussed with the patient that sometimes it is difficult to tell a cellulitis from a tenosynovitis, however, I suspect a tenosynovitis.  Stop painful enough to be a skin infection.  Patient is concerned because of the wounds and redness.  We will provide her with antibiotics that she was advised to stop as soon as she sees improvement.  As well as prednisone for the inflammatory component.  Rest.  Return if not improving in a few  days ?Final Clinical Impressions(s) / UC Diagnoses  ? ?Final diagnoses:  ?Tenosynovitis of left hand  ? ? ? ?  Discharge Instructions   ? ?  ?Take the Medrol Dosepak as directed ?Take all of day 1 today (3 now and 3 after supper) ?Take antibiotic 2 times a day.  May stop antibiotics if redness and swelling improve ?Try not to use left hand for heavy and repetitive activities until clears up ?See your primary care or Dr. Dianah Field if you fail to improve ? ? ?ED Prescriptions   ? ? Medication Sig Dispense Auth. Provider  ? methylPREDNISolone (MEDROL DOSEPAK) 4 MG TBPK tablet Take 6 tablets by mouth on day 1, then take 5 tablets on day 2, then 4 tablets on day 3, then 3 tablets on day 4, then 2 tablets on day 5, then 1 tablet on day 6 21 tablet Raylene Everts, MD  ? cefadroxil (DURICEF) 500 MG capsule Take 1 capsule (500 mg total) by mouth 2 (two) times daily. 14 capsule Raylene Everts, MD  ? ?  ? ?PDMP not reviewed this encounter. ?  ?Raylene Everts, MD ?02/12/22 1329 ? ?

## 2022-02-12 NOTE — Discharge Instructions (Signed)
Take the Medrol Dosepak as directed ?Take all of day 1 today (3 now and 3 after supper) ?Take antibiotic 2 times a day.  May stop antibiotics if redness and swelling improve ?Try not to use left hand for heavy and repetitive activities until clears up ?See your primary care or Dr. Dianah Field if you fail to improve ?

## 2022-02-12 NOTE — ED Triage Notes (Signed)
Pt presents with left hand swelling swelling that began after scratching it on Monday.  ?

## 2022-03-05 ENCOUNTER — Other Ambulatory Visit: Payer: Self-pay | Admitting: Physician Assistant

## 2022-03-05 ENCOUNTER — Other Ambulatory Visit (HOSPITAL_COMMUNITY): Payer: Self-pay

## 2022-03-06 ENCOUNTER — Other Ambulatory Visit (HOSPITAL_COMMUNITY): Payer: Self-pay

## 2022-03-06 MED ORDER — ESCITALOPRAM OXALATE 10 MG PO TABS
10.0000 mg | ORAL_TABLET | Freq: Every day | ORAL | 0 refills | Status: DC
  Filled 2022-03-06: qty 30, 30d supply, fill #0

## 2022-04-01 ENCOUNTER — Encounter: Payer: Self-pay | Admitting: Physician Assistant

## 2022-04-01 ENCOUNTER — Other Ambulatory Visit (HOSPITAL_BASED_OUTPATIENT_CLINIC_OR_DEPARTMENT_OTHER): Payer: Self-pay

## 2022-04-01 ENCOUNTER — Ambulatory Visit (INDEPENDENT_AMBULATORY_CARE_PROVIDER_SITE_OTHER): Payer: 59 | Admitting: Physician Assistant

## 2022-04-01 VITALS — BP 127/75 | HR 70 | Ht 61.5 in | Wt 143.0 lb

## 2022-04-01 DIAGNOSIS — Z0000 Encounter for general adult medical examination without abnormal findings: Secondary | ICD-10-CM

## 2022-04-01 DIAGNOSIS — F3340 Major depressive disorder, recurrent, in remission, unspecified: Secondary | ICD-10-CM

## 2022-04-01 DIAGNOSIS — Z1329 Encounter for screening for other suspected endocrine disorder: Secondary | ICD-10-CM | POA: Diagnosis not present

## 2022-04-01 DIAGNOSIS — E785 Hyperlipidemia, unspecified: Secondary | ICD-10-CM

## 2022-04-01 DIAGNOSIS — Z131 Encounter for screening for diabetes mellitus: Secondary | ICD-10-CM | POA: Diagnosis not present

## 2022-04-01 DIAGNOSIS — J4520 Mild intermittent asthma, uncomplicated: Secondary | ICD-10-CM

## 2022-04-01 DIAGNOSIS — N952 Postmenopausal atrophic vaginitis: Secondary | ICD-10-CM | POA: Diagnosis not present

## 2022-04-01 MED ORDER — ESCITALOPRAM OXALATE 10 MG PO TABS
10.0000 mg | ORAL_TABLET | Freq: Every day | ORAL | 3 refills | Status: DC
  Filled 2022-04-01: qty 90, 90d supply, fill #0
  Filled 2022-08-17: qty 90, 90d supply, fill #1
  Filled 2022-10-24 – 2022-11-03 (×3): qty 90, 90d supply, fill #2

## 2022-04-01 MED ORDER — ESTRADIOL 0.1 MG/GM VA CREA
1.0000 | TOPICAL_CREAM | VAGINAL | 3 refills | Status: DC
  Filled 2022-04-01: qty 42.5, 42d supply, fill #0
  Filled 2023-01-18: qty 42.5, 30d supply, fill #0
  Filled 2023-01-18: qty 42.5, 43d supply, fill #0

## 2022-04-01 MED ORDER — MONTELUKAST SODIUM 10 MG PO TABS
ORAL_TABLET | Freq: Every day | ORAL | 3 refills | Status: DC
Start: 2022-04-01 — End: 2022-11-24
  Filled 2022-04-01: qty 90, 90d supply, fill #0

## 2022-04-01 NOTE — Progress Notes (Signed)
? ?Complete physical exam ? ?Patient: Donna Ford   DOB: 1966/09/25   56 y.o. Female  MRN: [id number] ? ?Subjective:  ?  ?Chief Complaint  ?Patient presents with  ? Annual Exam  ? ? ?Donna Ford is a 56 y.o. female who presents today for a complete physical exam. She reports consuming a general diet. Home exercise routine includes Walking. She generally feels well. She reports sleeping well. She does not have additional problems to discuss today.  ? ? ?Most recent fall risk assessment: ? ?  04/01/2022  ?  2:29 PM  ?Fall Risk   ?Falls in the past year? 0  ?Number falls in past yr: 0  ?Injury with Fall? 0  ?Risk for fall due to : No Fall Risks  ?Follow up Falls evaluation completed  ? ?  ?Most recent depression screenings: ? ?  04/01/2022  ?  2:29 PM 12/18/2020  ?  2:00 PM  ?PHQ 2/9 Scores  ?PHQ - 2 Score 0 0  ?PHQ- 9 Score  0  ? ? ?Vision:Within last year and Dental: No current dental problems and Receives regular dental care ? ?Patient Active Problem List  ? Diagnosis Date Noted  ? Acute right-sided low back pain without sciatica 01/09/2022  ? No energy 12/20/2020  ? Female bladder prolapse 05/28/2020  ? Dyslipidemia 05/12/2018  ? Reactive airway disease 04/12/2018  ? Cough 04/07/2018  ? SOB (shortness of breath) 04/07/2018  ? Recurrent major depressive disorder, in remission (HCC) 05/12/2017  ? Trigger finger of both hands 05/12/2017  ? Onychomycosis due to dermatophyte 06/02/2016  ? Hyperlipemia 10/10/2015  ? Rhinitis, allergic 08/07/2015  ? Post menopausal problems 08/07/2015  ? Nasal congestion 08/07/2015  ? Rectal pain 10/22/2014  ? Esophageal reflux 10/19/2014  ? External hemorrhoid 10/19/2014  ? MVP (mitral valve prolapse) 09/09/2012  ? ?History reviewed. No pertinent past medical history. ?Family History  ?Problem Relation Age of Onset  ? Heart attack Father 34  ?     open heart at 42  ? Hypertension Father   ? ?Allergies  ?Allergen Reactions  ? Atorvastatin Other (See Comments)  ?  Neurological & vision  changes  ? ?  ? ?Patient Care Team: ?Nolene Ebbs as PCP - General (Family Medicine)  ? ?Outpatient Medications Prior to Visit  ?Medication Sig  ? fluticasone (FLONASE) 50 MCG/ACT nasal spray PLACE 2 SPRAYS INTO BOTH NOSTRILS DAILY.  ? [DISCONTINUED] cefadroxil (DURICEF) 500 MG capsule Take 1 capsule (500 mg total) by mouth 2 (two) times daily.  ? [DISCONTINUED] escitalopram (LEXAPRO) 10 MG tablet Take 1 tablet (10 mg total) by mouth daily.  ? [DISCONTINUED] escitalopram (LEXAPRO) 10 MG tablet Take 1 tablet (10 mg total) by mouth daily.  **Need Appt for refills  ? [DISCONTINUED] estradiol (ESTRACE) 0.1 MG/GM vaginal cream Place vaginally every other day.  ? [DISCONTINUED] methylPREDNISolone (MEDROL DOSEPAK) 4 MG TBPK tablet Take 6 tablets by mouth on day 1, then take 5 tablets on day 2, then 4 tablets on day 3, then 3 tablets on day 4, then 2 tablets on day 5, then 1 tablet on day 6  ? [DISCONTINUED] montelukast (SINGULAIR) 10 MG tablet TAKE 1 TABLET BY MOUTH AT BEDTIME  ? ?No facility-administered medications prior to visit.  ? ? ?Review of Systems  ?All other systems reviewed and are negative. ? ? ? ? ?   ?Objective:  ? ?  ?BP 127/75   Pulse 70   Ht 5' 1.5" (1.562 m)  Wt 143 lb (64.9 kg)   SpO2 96%   BMI 26.58 kg/m?  ?BP Readings from Last 3 Encounters:  ?04/01/22 127/75  ?02/12/22 138/77  ?12/18/20 (!) 127/57  ? ?Wt Readings from Last 3 Encounters:  ?04/01/22 143 lb (64.9 kg)  ?02/12/22 146 lb (66.2 kg)  ?12/18/20 155 lb (70.3 kg)  ? ?  ? ?Physical Exam  ?BP 127/75   Pulse 70   Ht 5' 1.5" (1.562 m)   Wt 143 lb (64.9 kg)   SpO2 96%   BMI 26.58 kg/m?  ? ?General Appearance:    Alert, cooperative, no distress, appears stated age  ?Head:    Normocephalic, without obvious abnormality, atraumatic  ?Eyes:    PERRL, conjunctiva/corneas clear, EOM's intact, fundi  ?  benign, both eyes  ?Ears:    Normal TM's and external ear canals, both ears  ?Nose:   Nares normal, septum midline, mucosa normal, no  drainage    or sinus tenderness  ?Throat:   Lips, mucosa, and tongue normal; teeth and gums normal  ?Neck:   Supple, symmetrical, trachea midline, no adenopathy;  ?  thyroid:  no enlargement/tenderness/nodules; no carotid ?  bruit or JVD  ?Back:     Symmetric, no curvature, ROM normal, no CVA tenderness  ?Lungs:     Clear to auscultation bilaterally, respirations unlabored  ?Chest Wall:    No tenderness or deformity  ? Heart:    Regular rate and rhythm, S1 and S2 normal, no murmur, rub   or gallop  ?   ?Abdomen:     Soft, non-tender, bowel sounds active all four quadrants,  ?  no masses, no organomegaly  ?   ?   ?Extremities:   Extremities normal, atraumatic, no cyanosis or edema  ?Pulses:   2+ and symmetric all extremities  ?Skin:   Skin color, texture, turgor normal, no rashes or lesions  ?Lymph nodes:   Cervical, supraclavicular, and axillary nodes normal  ?Neurologic:   CNII-XII intact, normal strength, sensation and reflexes  ?  throughout  ? ? ?  04/01/2022  ?  2:29 PM 12/18/2020  ?  2:00 PM 08/23/2019  ?  9:57 AM 05/11/2018  ?  1:26 PM 05/12/2017  ? 10:37 AM  ?Depression screen PHQ 2/9  ?Decreased Interest 0 0 0 0 0  ?Down, Depressed, Hopeless 0 0 0 0 0  ?PHQ - 2 Score 0 0 0 0 0  ?Altered sleeping  0 0  0  ?Tired, decreased energy  0 0  0  ?Change in appetite  0 0  0  ?Feeling bad or failure about yourself   0 0  0  ?Trouble concentrating  0 0  0  ?Moving slowly or fidgety/restless  0 0  0  ?Suicidal thoughts  0 0  0  ?PHQ-9 Score  0 0  0  ?Difficult doing work/chores   Not difficult at all    ? ?.. ? ?  12/18/2020  ?  2:00 PM 08/23/2019  ?  9:57 AM 05/12/2017  ? 10:37 AM  ?GAD 7 : Generalized Anxiety Score  ?Nervous, Anxious, on Edge 0 0 0  ?Control/stop worrying 0 0 0  ?Worry too much - different things 0 0 0  ?Trouble relaxing 0 0 0  ?Restless 0 0 0  ?Easily annoyed or irritable 0 0 0  ?Afraid - awful might happen 0 0 0  ?Total GAD 7 Score 0 0 0  ?Anxiety Difficulty  Not difficult at all Not  difficult at all  ? ? ? ?    ?Assessment & Plan:  ?  ?Routine Health Maintenance and Physical Exam ? ?Immunization History  ?Administered Date(s) Administered  ? Influenza-Unspecified 09/20/2016, 10/02/2020  ? MMR 07/15/2014  ? ? ?Health Maintenance  ?Topic Date Due  ? Zoster Vaccines- Shingrix (1 of 2) 07/01/2022 (Originally 06/26/2014)  ? INFLUENZA VACCINE  07/07/2022  ? MAMMOGRAM  12/04/2023  ? TETANUS/TDAP  12/08/2023  ? COLONOSCOPY (Pts 45-63yrs Insurance coverage will need to be confirmed)  05/14/2025  ? Hepatitis C Screening  Completed  ? HIV Screening  Completed  ? HPV VACCINES  Aged Out  ? ? ?Discussed health benefits of physical activity, and encouraged her to engage in regular exercise appropriate for her age and condition. ? ?Marland KitchenKenney Houseman was seen today for annual exam. ? ?Diagnoses and all orders for this visit: ? ?Routine physical examination ?-     TSH ?-     Lipid Panel w/reflex Direct LDL ?-     COMPLETE METABOLIC PANEL WITH GFR ?-     CBC with Differential/Platelet ? ?Dyslipidemia ?-     Lipid Panel w/reflex Direct LDL ? ?Screening for diabetes mellitus ?-     COMPLETE METABOLIC PANEL WITH GFR ? ?Thyroid disorder screen ?-     TSH ? ?Recurrent major depressive disorder, in remission ([place]) ?-     escitalopram (LEXAPRO) 10 MG tablet; Take 1 tablet (10 mg total) by mouth daily. ? ?Mild intermittent reactive airway disease without complication ?-     montelukast (SINGULAIR) 10 MG tablet; TAKE 1 TABLET BY MOUTH AT BEDTIME ? ?Atrophic vaginitis ?-     estradiol (ESTRACE) 0.1 MG/GM vaginal cream; Place 1 Applicatorful vaginally every other day. 2g Place vaginally every other day. ? ?.. ?Discussed 150 minutes of exercise a week.  ?Encouraged vitamin D 1000 units and Calcium $RemoveBef'1300mg'[id number]$  or 4 servings of dairy a day.  ? ?Declined shingrix ?PHQ no concerns, lexapro refilled ?Fasting labs ordered ?Mammogram UTD ?Colonoscopy UTD ?PAP not needed due to hysterectomy ?Covid and flu UTD ? ?Return in about 1 year (around 04/02/2023). ? ?  ? ?Iran Planas, PA-C ? ? ?

## 2022-04-01 NOTE — Patient Instructions (Signed)

## 2022-04-08 ENCOUNTER — Other Ambulatory Visit (HOSPITAL_BASED_OUTPATIENT_CLINIC_OR_DEPARTMENT_OTHER): Payer: Self-pay

## 2022-04-15 DIAGNOSIS — Z1329 Encounter for screening for other suspected endocrine disorder: Secondary | ICD-10-CM | POA: Diagnosis not present

## 2022-04-15 DIAGNOSIS — E785 Hyperlipidemia, unspecified: Secondary | ICD-10-CM | POA: Diagnosis not present

## 2022-04-15 DIAGNOSIS — Z0000 Encounter for general adult medical examination without abnormal findings: Secondary | ICD-10-CM | POA: Diagnosis not present

## 2022-04-15 DIAGNOSIS — Z131 Encounter for screening for diabetes mellitus: Secondary | ICD-10-CM | POA: Diagnosis not present

## 2022-04-16 LAB — COMPLETE METABOLIC PANEL WITH GFR
AG Ratio: 1.8 (calc) (ref 1.0–2.5)
ALT: 8 U/L (ref 6–29)
AST: 11 U/L (ref 10–35)
Albumin: 4.2 g/dL (ref 3.6–5.1)
Alkaline phosphatase (APISO): 59 U/L (ref 37–153)
BUN: 14 mg/dL (ref 7–25)
CO2: 26 mmol/L (ref 20–32)
Calcium: 9.4 mg/dL (ref 8.6–10.4)
Chloride: 105 mmol/L (ref 98–110)
Creat: 0.65 mg/dL (ref 0.50–1.03)
Globulin: 2.4 g/dL (calc) (ref 1.9–3.7)
Glucose, Bld: 89 mg/dL (ref 65–99)
Potassium: 4.2 mmol/L (ref 3.5–5.3)
Sodium: 140 mmol/L (ref 135–146)
Total Bilirubin: 0.5 mg/dL (ref 0.2–1.2)
Total Protein: 6.6 g/dL (ref 6.1–8.1)
eGFR: 103 mL/min/{1.73_m2} (ref >=60)

## 2022-04-16 LAB — CBC WITH DIFFERENTIAL/PLATELET
Absolute Monocytes: 293 cells/uL (ref 200–950)
Basophils Absolute: 38 cells/uL (ref 0–200)
Basophils Relative: 0.8 %
Eosinophils Absolute: 168 cells/uL (ref 15–500)
Eosinophils Relative: 3.5 %
HCT: 41.1 % (ref 35.0–45.0)
Hemoglobin: 13.7 g/dL (ref 11.7–15.5)
Lymphs Abs: 2093 cells/uL (ref 850–3900)
MCH: 29.8 pg (ref 27.0–33.0)
MCHC: 33.3 g/dL (ref 32.0–36.0)
MCV: 89.3 fL (ref 80.0–100.0)
MPV: 10.9 fL (ref 7.5–12.5)
Monocytes Relative: 6.1 %
Neutro Abs: 2208 cells/uL (ref 1500–7800)
Neutrophils Relative %: 46 %
Platelets: 276 10*3/uL (ref 140–400)
RBC: 4.6 10*6/uL (ref 3.80–5.10)
RDW: 12.8 % (ref 11.0–15.0)
Total Lymphocyte: 43.6 %
WBC: 4.8 10*3/uL (ref 3.8–10.8)

## 2022-04-16 LAB — LIPID PANEL W/REFLEX DIRECT LDL
Cholesterol: 296 mg/dL — ABNORMAL HIGH (ref <200)
HDL: 47 mg/dL — ABNORMAL LOW (ref >=50)
LDL Cholesterol (Calc): 219 mg/dL (calc) — ABNORMAL HIGH
Non-HDL Cholesterol (Calc): 249 mg/dL (calc) — ABNORMAL HIGH (ref <130)
Total CHOL/HDL Ratio: 6.3 (calc) — ABNORMAL HIGH (ref <5.0)
Triglycerides: 143 mg/dL (ref <150)

## 2022-04-16 LAB — TSH: TSH: 1.17 mIU/L (ref 0.40–4.50)

## 2022-04-16 NOTE — Progress Notes (Signed)
Normal hemoglobin ?Kidney, liver, glucose look great.  ?Thyroid look great.  ? ? ?Marland Kitchen.The 10-year ASCVD risk score (Arnett DK, et al., 2019) is: 3.9% ?  Values used to calculate the score: ?    Age: 56 years ?    Sex: Female ?    Is Non-Hispanic African American: No ?    Diabetic: No ?    Tobacco smoker: No ?    Systolic Blood Pressure: 989 mmHg ?    Is BP treated: No ?    HDL Cholesterol: 47 mg/dL ?    Total Cholesterol: 296 mg/dL ? ?Cholesterol is not to goal but overall risk is low at 3.9 percent however LDL is significantly how of range at 219. Highly suggested to start a statin to lower LDL.  ?

## 2022-06-17 ENCOUNTER — Other Ambulatory Visit (HOSPITAL_COMMUNITY): Payer: Self-pay

## 2022-07-01 DIAGNOSIS — D225 Melanocytic nevi of trunk: Secondary | ICD-10-CM | POA: Diagnosis not present

## 2022-07-01 DIAGNOSIS — L820 Inflamed seborrheic keratosis: Secondary | ICD-10-CM | POA: Diagnosis not present

## 2022-07-01 DIAGNOSIS — Z85828 Personal history of other malignant neoplasm of skin: Secondary | ICD-10-CM | POA: Diagnosis not present

## 2022-07-01 DIAGNOSIS — L821 Other seborrheic keratosis: Secondary | ICD-10-CM | POA: Diagnosis not present

## 2022-07-01 DIAGNOSIS — L814 Other melanin hyperpigmentation: Secondary | ICD-10-CM | POA: Diagnosis not present

## 2022-07-01 DIAGNOSIS — D2372 Other benign neoplasm of skin of left lower limb, including hip: Secondary | ICD-10-CM | POA: Diagnosis not present

## 2022-07-01 DIAGNOSIS — L570 Actinic keratosis: Secondary | ICD-10-CM | POA: Diagnosis not present

## 2022-07-01 DIAGNOSIS — D1801 Hemangioma of skin and subcutaneous tissue: Secondary | ICD-10-CM | POA: Diagnosis not present

## 2022-07-08 ENCOUNTER — Encounter: Payer: Self-pay | Admitting: Neurology

## 2022-08-17 ENCOUNTER — Other Ambulatory Visit (HOSPITAL_COMMUNITY): Payer: Self-pay

## 2022-10-26 ENCOUNTER — Other Ambulatory Visit (HOSPITAL_COMMUNITY): Payer: Self-pay

## 2022-10-27 ENCOUNTER — Other Ambulatory Visit (HOSPITAL_COMMUNITY): Payer: Self-pay

## 2022-10-28 ENCOUNTER — Other Ambulatory Visit: Payer: Self-pay

## 2022-10-28 DIAGNOSIS — Z1231 Encounter for screening mammogram for malignant neoplasm of breast: Secondary | ICD-10-CM

## 2022-11-03 ENCOUNTER — Other Ambulatory Visit (HOSPITAL_COMMUNITY): Payer: Self-pay

## 2022-11-11 DIAGNOSIS — Z1231 Encounter for screening mammogram for malignant neoplasm of breast: Secondary | ICD-10-CM

## 2022-11-18 DIAGNOSIS — H52221 Regular astigmatism, right eye: Secondary | ICD-10-CM | POA: Diagnosis not present

## 2022-11-18 DIAGNOSIS — H5203 Hypermetropia, bilateral: Secondary | ICD-10-CM | POA: Diagnosis not present

## 2022-11-18 DIAGNOSIS — H524 Presbyopia: Secondary | ICD-10-CM | POA: Diagnosis not present

## 2022-11-24 ENCOUNTER — Encounter: Payer: Self-pay | Admitting: Emergency Medicine

## 2022-11-24 ENCOUNTER — Other Ambulatory Visit (HOSPITAL_BASED_OUTPATIENT_CLINIC_OR_DEPARTMENT_OTHER): Payer: Self-pay

## 2022-11-24 ENCOUNTER — Ambulatory Visit
Admission: EM | Admit: 2022-11-24 | Discharge: 2022-11-24 | Disposition: A | Discharge status: Home / Self Care | Payer: 59 | Attending: Family Medicine | Admitting: Family Medicine

## 2022-11-24 DIAGNOSIS — J101 Influenza due to other identified influenza virus with other respiratory manifestations: Secondary | ICD-10-CM | POA: Diagnosis not present

## 2022-11-24 LAB — POCT INFLUENZA A/B
Influenza A, POC: POSITIVE — AB
Influenza B, POC: NEGATIVE

## 2022-11-24 LAB — POC SARS CORONAVIRUS 2 AG -  ED: SARS Coronavirus 2 Ag: NEGATIVE

## 2022-11-24 MED ORDER — ACETAMINOPHEN 325 MG PO TABS
650.0000 mg | ORAL_TABLET | Freq: Once | ORAL | Status: AC
  Administered 2022-11-24: 650 mg via ORAL

## 2022-11-24 MED ORDER — PROMETHAZINE-DM 6.25-15 MG/5ML PO SYRP
5.0000 mL | ORAL_SOLUTION | Freq: Four times a day (QID) | ORAL | 0 refills | Status: DC | PRN
  Filled 2022-11-24: qty 118, 6d supply, fill #0

## 2022-11-24 MED ORDER — OSELTAMIVIR PHOSPHATE 75 MG PO CAPS
75.0000 mg | ORAL_CAPSULE | Freq: Two times a day (BID) | ORAL | 0 refills | Status: DC
  Filled 2022-11-24: qty 10, 5d supply, fill #0

## 2022-11-24 NOTE — Discharge Instructions (Signed)
Take the Tamiflu 2 times a day.  Take 2 doses today Get plenty of rest.  Tylenol or ibuprofen for pain and fever May use over-the-counter cough medicine.  I prescribed some Phenergan DM for when cough is severe.  This will cause drowsiness Call for problems

## 2022-11-24 NOTE — ED Triage Notes (Signed)
Cough since Sunday  OTC tylenol cold and flu - last dose 0200 Chills & body aches

## 2022-11-24 NOTE — ED Provider Notes (Signed)
[name]    CSN: [id number] Arrival date & time: 11/24/22  0820      History   Chief Complaint Chief Complaint  Patient presents with   Cough    HPI Donna Ford is a 56 y.o. female.   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  She has been sick since Sunday, all day Monday and today is Tuesday.  She is here with 102.9 fever.  States that she has body aches, weakness, some cough and cold symptoms.  Fever and chills are prevalent.  Unknown exposure to illness.  She is immunized for COVID and for influenza  History reviewed. No pertinent past medical history.  Patient Active Problem List   Diagnosis Date Noted   Acute right-sided low back pain without sciatica 01/09/2022   No energy 12/20/2020   Female bladder prolapse 05/28/2020   Dyslipidemia 05/12/2018   Reactive airway disease 04/12/2018   Cough 04/07/2018   SOB (shortness of breath) 04/07/2018   Recurrent major depressive disorder, in remission ([place]) 05/12/2017   Trigger finger of both hands 05/12/2017   Onychomycosis due to dermatophyte 06/02/2016   Hyperlipemia 10/10/2015   Rhinitis, allergic 08/07/2015   Post menopausal problems 08/07/2015   Nasal congestion 08/07/2015   Rectal pain 10/22/2014   Esophageal reflux 10/19/2014   External hemorrhoid 10/19/2014   MVP (mitral valve prolapse) 09/09/2012    Past Surgical History:  Procedure Laterality Date   PELVIC FLOOR REPAIR     TOTAL ABDOMINAL HYSTERECTOMY      OB History   No obstetric history on file.      Home Medications    Prior to Admission medications   Medication Sig Start Date End Date Taking? Authorizing Provider  oseltamivir (TAMIFLU) 75 MG capsule Take 1 capsule (75 mg total) by mouth every 12 (twelve) hours. 11/24/22  Yes Raylene Everts, MD  promethazine-dextromethorphan (PROMETHAZINE-DM) 6.25-15 MG/5ML syrup Take 5 mLs by mouth 4 (four) times daily as needed for cough. 11/24/22  Yes Raylene Everts, MD   escitalopram (LEXAPRO) 10 MG tablet Take 1 tablet (10 mg total) by mouth daily. 04/01/22 03/27/23  Donella Stade, PA-C  estradiol (ESTRACE) 0.1 MG/GM vaginal cream Place 1 Applicatorful vaginally every other day. 2g Place vaginally every other day. 04/01/22   Breeback, Jade L, PA-C  fluticasone (FLONASE) 50 MCG/ACT nasal spray PLACE 2 SPRAYS INTO BOTH NOSTRILS DAILY. 01/22/22 01/22/23  Donella Stade, PA-C    Family History Family History  Problem Relation Age of Onset   Heart attack Father 41       open heart at 42   Hypertension Father     Social History Social History   Tobacco Use   Smoking status: Never   Smokeless tobacco: Never  Vaping Use   Vaping Use: Never used  Substance Use Topics   Alcohol use: No    Alcohol/week: 0.0 standard drinks of alcohol   Drug use: No     Allergies   Atorvastatin   Review of Systems Review of Systems See HPI  Physical Exam Triage Vital Signs ED Triage Vitals  Enc Vitals Group     BP 11/24/22 0914 114/70     Pulse Rate 11/24/22 0914 94     Resp 11/24/22 0914 16     Temp 11/24/22 0914 (!) 102.9 F (39.4 C)     Temp Source 11/24/22 0914 Oral     SpO2 11/24/22 0914 98 %     Weight  11/24/22 0918 139 lb (63 kg)     Height 11/24/22 0918 5' 1.5" (1.562 m)     Head Circumference --      Peak Flow --      Pain Score 11/24/22 0917 6     Pain Loc --      Pain Edu? --      Excl. in [place]? --    No data found.  Updated Vital Signs BP 114/70 (BP Location: Left Arm)   Pulse 94   Temp (!) 102.9 F (39.4 C) (Oral)   Resp 16   Ht 5' 1.5" (1.562 m)   Wt 63 kg   SpO2 98%   BMI 25.84 kg/m       Physical Exam Constitutional:      General: She is not in acute distress.    Appearance: She is well-developed. She is ill-appearing.  HENT:     Head: Normocephalic and atraumatic.  Eyes:     Conjunctiva/sclera: Conjunctivae normal.     Pupils: Pupils are equal, round, and reactive to light.  Cardiovascular:     Rate and Rhythm:  Normal rate.  Pulmonary:     Effort: Pulmonary effort is normal. No respiratory distress.  Abdominal:     General: There is no distension.     Palpations: Abdomen is soft.  Musculoskeletal:        General: Normal range of motion.     Cervical back: Normal range of motion.  Skin:    General: Skin is warm and dry.  Neurological:     Mental Status: She is alert.      UC Treatments / Results  Labs (all labs ordered are listed, but only abnormal results are displayed) Labs Reviewed  POCT INFLUENZA A/B - Abnormal; Notable for the following components:      Result Value   Influenza A, POC Positive (*)    All other components within normal limits  POC SARS CORONAVIRUS 2 AG -  ED    EKG   Radiology No results found.  Procedures Procedures (including critical care time)  Medications Ordered in UC Medications  acetaminophen (TYLENOL) tablet 650 mg (650 mg Oral Given 11/24/22 1448)    Initial Impression / Assessment and Plan / UC Course  I have reviewed the triage vital signs and the nursing notes.  Pertinent labs & imaging results that were available during my care of the patient were reviewed by me and considered in my medical decision making (see chart for details).     Discussed home treatment of influenza.  Needs to stay out of work until has no fever for 24 hours and improvement in symptoms.  Tamiflu was prescribed with precautions.  Call for problems Final Clinical Impressions(s) / UC Diagnoses   Final diagnoses:  Influenza A     Discharge Instructions      Take the Tamiflu 2 times a day.  Take 2 doses today Get plenty of rest.  Tylenol or ibuprofen for pain and fever May use over-the-counter cough medicine.  I prescribed some Phenergan DM for when cough is severe.  This will cause drowsiness Call for problems      ED Prescriptions     Medication Sig Dispense Auth. Provider   oseltamivir (TAMIFLU) 75 MG capsule Take 1 capsule (75 mg total) by mouth  every 12 (twelve) hours. 10 capsule Raylene Everts, MD   promethazine-dextromethorphan (PROMETHAZINE-DM) 6.25-15 MG/5ML syrup Take 5 mLs by mouth 4 (four) times daily as needed for  cough. 118 mL Raylene Everts, MD      PDMP not reviewed this encounter.   Raylene Everts, MD 11/24/22 [phone]

## 2022-12-08 ENCOUNTER — Other Ambulatory Visit (HOSPITAL_BASED_OUTPATIENT_CLINIC_OR_DEPARTMENT_OTHER): Payer: Self-pay | Admitting: Physician Assistant

## 2022-12-08 DIAGNOSIS — Z1231 Encounter for screening mammogram for malignant neoplasm of breast: Secondary | ICD-10-CM

## 2022-12-09 ENCOUNTER — Ambulatory Visit (INDEPENDENT_AMBULATORY_CARE_PROVIDER_SITE_OTHER): Payer: 59

## 2022-12-09 DIAGNOSIS — Z1231 Encounter for screening mammogram for malignant neoplasm of breast: Secondary | ICD-10-CM

## 2022-12-10 ENCOUNTER — Other Ambulatory Visit: Payer: Self-pay | Admitting: Physician Assistant

## 2022-12-10 DIAGNOSIS — J4520 Mild intermittent asthma, uncomplicated: Secondary | ICD-10-CM

## 2022-12-11 ENCOUNTER — Other Ambulatory Visit (HOSPITAL_COMMUNITY): Payer: Self-pay

## 2022-12-11 MED ORDER — FLUTICASONE PROPIONATE 50 MCG/ACT NA SUSP
2.0000 | Freq: Every day | NASAL | 4 refills | Status: DC
  Filled 2022-12-11: qty 16, 30d supply, fill #0

## 2022-12-11 NOTE — Progress Notes (Signed)
Normal mammogram. Follow up in 1 year.

## 2023-01-18 ENCOUNTER — Other Ambulatory Visit (HOSPITAL_COMMUNITY): Payer: Self-pay

## 2023-01-19 ENCOUNTER — Other Ambulatory Visit (HOSPITAL_COMMUNITY): Payer: Self-pay

## 2023-04-07 ENCOUNTER — Other Ambulatory Visit (HOSPITAL_COMMUNITY): Payer: Self-pay

## 2023-04-07 ENCOUNTER — Other Ambulatory Visit (HOSPITAL_BASED_OUTPATIENT_CLINIC_OR_DEPARTMENT_OTHER): Payer: Self-pay

## 2023-04-07 ENCOUNTER — Ambulatory Visit (INDEPENDENT_AMBULATORY_CARE_PROVIDER_SITE_OTHER): Payer: 59 | Admitting: Physician Assistant

## 2023-04-07 ENCOUNTER — Encounter: Payer: Self-pay | Admitting: Physician Assistant

## 2023-04-07 VITALS — BP 122/72 | HR 82 | Ht 61.0 in | Wt 138.6 lb

## 2023-04-07 DIAGNOSIS — W57XXXA Bitten or stung by nonvenomous insect and other nonvenomous arthropods, initial encounter: Secondary | ICD-10-CM | POA: Diagnosis not present

## 2023-04-07 DIAGNOSIS — Z0000 Encounter for general adult medical examination without abnormal findings: Secondary | ICD-10-CM

## 2023-04-07 DIAGNOSIS — J302 Other seasonal allergic rhinitis: Secondary | ICD-10-CM | POA: Diagnosis not present

## 2023-04-07 DIAGNOSIS — Z13220 Encounter for screening for lipoid disorders: Secondary | ICD-10-CM

## 2023-04-07 DIAGNOSIS — S30861A Insect bite (nonvenomous) of abdominal wall, initial encounter: Secondary | ICD-10-CM | POA: Diagnosis not present

## 2023-04-07 DIAGNOSIS — E785 Hyperlipidemia, unspecified: Secondary | ICD-10-CM

## 2023-04-07 DIAGNOSIS — Z131 Encounter for screening for diabetes mellitus: Secondary | ICD-10-CM

## 2023-04-07 DIAGNOSIS — F3340 Major depressive disorder, recurrent, in remission, unspecified: Secondary | ICD-10-CM

## 2023-04-07 DIAGNOSIS — N952 Postmenopausal atrophic vaginitis: Secondary | ICD-10-CM | POA: Diagnosis not present

## 2023-04-07 MED ORDER — ESCITALOPRAM OXALATE 10 MG PO TABS
10.0000 mg | ORAL_TABLET | Freq: Every day | ORAL | 3 refills | Status: DC
  Filled 2023-04-07: qty 90, 90d supply, fill #0
  Filled 2023-08-02: qty 90, 90d supply, fill #1
  Filled 2023-11-01: qty 90, 90d supply, fill #2
  Filled 2023-11-03: qty 90, 90d supply, fill #0
  Filled 2024-02-21: qty 90, 90d supply, fill #1

## 2023-04-07 MED ORDER — MONTELUKAST SODIUM 10 MG PO TABS
10.0000 mg | ORAL_TABLET | Freq: Every day | ORAL | 3 refills | Status: DC
  Filled 2023-04-07: qty 90, 90d supply, fill #0

## 2023-04-07 MED ORDER — ESTRADIOL 0.1 MG/GM VA CREA
1.0000 | TOPICAL_CREAM | VAGINAL | 3 refills | Status: DC
  Filled 2023-04-07: qty 42.5, 43d supply, fill #0

## 2023-04-07 MED ORDER — DOXYCYCLINE HYCLATE 100 MG PO TABS
200.0000 mg | ORAL_TABLET | Freq: Once | ORAL | 0 refills | Status: DC
  Filled 2023-04-07: qty 2, 1d supply, fill #0

## 2023-04-07 MED ORDER — DOXYCYCLINE HYCLATE 100 MG PO TABS
200.0000 mg | ORAL_TABLET | Freq: Once | ORAL | 0 refills | Status: AC
Start: 2023-04-07 — End: 2023-04-08
  Filled 2023-04-07: qty 2, 1d supply, fill #0

## 2023-04-07 NOTE — Progress Notes (Signed)
Complete physical exam  Patient: Donna Ford   DOB: 02-07-1966   57 y.o. Female  MRN: [id number]  Subjective:    Chief Complaint  Patient presents with   Annual Exam    Donna Ford is a 57 y.o. female who presents today for a complete physical exam. She reports consuming a general diet.  She walks regularly and stays active watching her grandson.   She generally feels well. She reports sleeping well. She does have additional problems to discuss today.   She pulled a tick off last night. Was not engorged. Feels like foreign body left in the bite on her right lower abdomen. No fever, chills, body aches, rash.    Most recent fall risk assessment:    04/01/2022    2:29 PM  Fall Risk   Falls in the past year? 0  Number falls in past yr: 0  Injury with Fall? 0  Risk for fall due to : No Fall Risks  Follow up Falls evaluation completed     Most recent depression screenings:    04/07/2023   11:00 AM 04/01/2022    3:13 PM  PHQ 2/9 Scores  PHQ - 2 Score 0 0  PHQ- 9 Score 0 0    Vision:Within last year and Dental: No current dental problems and Receives regular dental care  Patient Active Problem List   Diagnosis Date Noted   Acute right-sided low back pain without sciatica 01/09/2022   No energy 12/20/2020   Female bladder prolapse 05/28/2020   Dyslipidemia 05/12/2018   Reactive airway disease 04/12/2018   Cough 04/07/2018   SOB (shortness of breath) 04/07/2018   Recurrent major depressive disorder, in remission (HCC) 05/12/2017   Trigger finger of both hands 05/12/2017   Onychomycosis due to dermatophyte 06/02/2016   Hyperlipemia 10/10/2015   Rhinitis, allergic 08/07/2015   Post menopausal problems 08/07/2015   Nasal congestion 08/07/2015   Rectal pain 10/22/2014   Esophageal reflux 10/19/2014   External hemorrhoid 10/19/2014   MVP (mitral valve prolapse) 09/09/2012   History reviewed. No pertinent past medical history. Past Surgical History:  Procedure  Laterality Date   PELVIC FLOOR REPAIR     TOTAL ABDOMINAL HYSTERECTOMY     Family History  Problem Relation Age of Onset   Heart attack Father 20       open heart at 57   Hypertension Father    Allergies  Allergen Reactions   Atorvastatin Other (See Comments)    Neurological & vision changes      Patient Care Team: Nolene Ebbs as PCP - General (Family Medicine)   Outpatient Medications Prior to Visit  Medication Sig   fluticasone (FLONASE) 50 MCG/ACT nasal spray PLACE 2 SPRAYS INTO BOTH NOSTRILS DAILY.   [DISCONTINUED] estradiol (ESTRACE) 0.1 MG/GM vaginal cream Place 2 grams vaginally every other day.   [DISCONTINUED] escitalopram (LEXAPRO) 10 MG tablet Take 1 tablet (10 mg total) by mouth daily.   [DISCONTINUED] oseltamivir (TAMIFLU) 75 MG capsule Take 1 capsule (75 mg total) by mouth every 12 (twelve) hours. (Patient not taking: Reported on 04/07/2023)   [DISCONTINUED] promethazine-dextromethorphan (PROMETHAZINE-DM) 6.25-15 MG/5ML syrup Take 5 mLs by mouth 4 (four) times daily as needed for cough. (Patient not taking: Reported on 04/07/2023)   No facility-administered medications prior to visit.    ROS  See HPI.       Objective:     BP (!) 142/77   Pulse 82   Ht 5\' 1"  (1.549 m)  Wt 138 lb 9.6 oz (62.9 kg)   SpO2 99%   BMI 26.19 kg/m  BP Readings from Last 3 Encounters:  04/07/23 (!) 142/77  11/24/22 114/70  04/01/22 127/75   Wt Readings from Last 3 Encounters:  04/07/23 138 lb 9.6 oz (62.9 kg)  11/24/22 139 lb (63 kg)  04/01/22 143 lb (64.9 kg)      Physical Exam  BP (!) 142/77   Pulse 82   Ht 5\' 1"  (1.549 m)   Wt 138 lb 9.6 oz (62.9 kg)   SpO2 99%   BMI 26.19 kg/m   General Appearance:    Alert, cooperative, no distress, appears stated age  Head:    Normocephalic, without obvious abnormality, atraumatic  Eyes:    PERRL, conjunctiva/corneas clear, EOM's intact, fundi    benign, both eyes  Ears:    Normal TM's and external ear canals,  both ears  Nose:   Nares normal, septum midline, mucosa normal, no drainage    or sinus tenderness  Throat:   Lips, mucosa, and tongue normal; teeth and gums normal  Neck:   Supple, symmetrical, trachea midline, no adenopathy;    thyroid:  no enlargement/tenderness/nodules; no carotid   bruit or JVD  Back:     Symmetric, no curvature, ROM normal, no CVA tenderness  Lungs:     Clear to auscultation bilaterally, respirations unlabored  Chest Wall:    No tenderness or deformity   Heart:    Regular rate and rhythm, S1 and S2 normal, no murmur, rub   or gallop     Abdomen:     Soft, non-tender, bowel sounds active all four quadrants,    no masses, no organomegaly, tick bite with foreign body right lower abdomen        Extremities:   Extremities normal, atraumatic, no cyanosis or edema  Pulses:   2+ and symmetric all extremities  Skin:   Skin color, texture, turgor normal, no rashes or lesions  Lymph nodes:   Cervical, supraclavicular, and axillary nodes normal  Neurologic:   CNII-XII intact, normal strength, sensation and reflexes    throughout      Assessment & Plan:    Routine Health Maintenance and Physical Exam  Immunization History  Administered Date(s) Administered   Influenza-Unspecified 09/20/2016, 10/02/2020   MMR 07/15/2014    Health Maintenance  Topic Date Due   DTaP/Tdap/Td (1 - Tdap) Never done   COVID-19 Vaccine (1) 04/23/2023 (Originally 12/27/1966)   Zoster Vaccines- Shingrix (1 of 2) 07/08/2023 (Originally 06/26/2014)   INFLUENZA VACCINE  07/08/2023   MAMMOGRAM  12/09/2024   COLONOSCOPY (Pts 45-56yrs Insurance coverage will need to be confirmed)  05/14/2025   Hepatitis C Screening  Completed   HIV Screening  Completed   HPV VACCINES  Aged Out    Discussed health benefits of physical activity, and encouraged her to engage in regular exercise appropriate for her age and condition.  Marland KitchenArchie Ford was seen today for annual exam.  Diagnoses and all orders for this  visit:  Routine physical examination -     TSH -     CBC with Differential/Platelet -     VITAMIN D 25 Hydroxy (Vit-D Deficiency, Fractures) -     Comprehensive Metabolic Panel (CMET) -     Lipid panel  Dyslipidemia -     Comprehensive Metabolic Panel (CMET)  Recurrent major depressive disorder, in remission (HCC) -     escitalopram (LEXAPRO) 10 MG tablet; Take 1 tablet (10 mg total)  by mouth daily. -     Comprehensive Metabolic Panel (CMET)  Atrophic vaginitis -     estradiol (ESTRACE) 0.1 MG/GM vaginal cream; Place 2 grams vaginally every other day. -     Comprehensive Metabolic Panel (CMET)  Tick bite of abdominal wall, initial encounter -     Comprehensive Metabolic Panel (CMET) -     doxycycline (VIBRA-TABS) 100 MG tablet; Take 2 tablets (200 mg total) by mouth once for 1 dose.  Seasonal allergies -     montelukast (SINGULAIR) 10 MG tablet; Take 1 tablet (10 mg total) by mouth at bedtime. -     Comprehensive Metabolic Panel (CMET)  Screening for diabetes mellitus -     Comprehensive Metabolic Panel (CMET)  Screening for lipid disorders -     Comprehensive Metabolic Panel (CMET) -     Lipid panel  Other orders -     Discontinue: doxycycline (VIBRA-TABS) 100 MG tablet; Take 2 tablets (200 mg total) by mouth once for 1 dose.   .. Discussed 150 minutes of exercise a week.  Encouraged vitamin D 1000 units and Calcium 1300mg  or 4 servings of dairy a day.  PHQ no concerns, refilled lexapro Seasonal allergies, refilled singulair Fasting labs ordered Colonoscopy/mammogram UTD Declined all vaccines.  Doxycycline one dose prophaxis given for tick bite and infection Discussed worrisome signs of lymes or RMSF Site prep and cleaned and remaining tick was removed from right lower abdomen      Tandy Gaw, PA-C

## 2023-04-08 ENCOUNTER — Other Ambulatory Visit (HOSPITAL_COMMUNITY): Payer: Self-pay

## 2023-04-29 DIAGNOSIS — Z0000 Encounter for general adult medical examination without abnormal findings: Secondary | ICD-10-CM | POA: Diagnosis not present

## 2023-04-30 LAB — COMPREHENSIVE METABOLIC PANEL
ALT: 13 IU/L (ref 0–32)
AST: 16 IU/L (ref 0–40)
Albumin/Globulin Ratio: 2 (ref 1.2–2.2)
Albumin: 4.3 g/dL (ref 3.8–4.9)
Alkaline Phosphatase: 78 IU/L (ref 44–121)
BUN/Creatinine Ratio: 18 (ref 9–23)
BUN: 12 mg/dL (ref 6–24)
Bilirubin Total: 0.4 mg/dL (ref 0.0–1.2)
CO2: 24 mmol/L (ref 20–29)
Calcium: 9.4 mg/dL (ref 8.7–10.2)
Chloride: 103 mmol/L (ref 96–106)
Creatinine, Ser: 0.67 mg/dL (ref 0.57–1.00)
Globulin, Total: 2.1 g/dL (ref 1.5–4.5)
Glucose: 87 mg/dL (ref 70–99)
Potassium: 4.1 mmol/L (ref 3.5–5.2)
Sodium: 140 mmol/L (ref 134–144)
Total Protein: 6.4 g/dL (ref 6.0–8.5)
eGFR: 101 mL/min/{1.73_m2} (ref >59)

## 2023-04-30 LAB — LIPID PANEL
Chol/HDL Ratio: 5.5 ratio — ABNORMAL HIGH (ref 0.0–4.4)
Cholesterol, Total: 294 mg/dL — ABNORMAL HIGH (ref 100–199)
HDL: 53 mg/dL (ref >39)
LDL Chol Calc (NIH): 217 mg/dL — ABNORMAL HIGH (ref 0–99)
Triglycerides: 133 mg/dL (ref 0–149)
VLDL Cholesterol Cal: 24 mg/dL (ref 5–40)

## 2023-04-30 LAB — CBC WITH DIFFERENTIAL/PLATELET
Basophils Absolute: 0.1 10*3/uL (ref 0.0–0.2)
Basos: 1 %
EOS (ABSOLUTE): 0.1 10*3/uL (ref 0.0–0.4)
Eos: 2 %
Hematocrit: 39.8 % (ref 34.0–46.6)
Hemoglobin: 13.1 g/dL (ref 11.1–15.9)
Immature Grans (Abs): 0 10*3/uL (ref 0.0–0.1)
Immature Granulocytes: 0 %
Lymphocytes Absolute: 2.2 10*3/uL (ref 0.7–3.1)
Lymphs: 46 %
MCH: 28.8 pg (ref 26.6–33.0)
MCHC: 32.9 g/dL (ref 31.5–35.7)
MCV: 88 fL (ref 79–97)
Monocytes Absolute: 0.3 10*3/uL (ref 0.1–0.9)
Monocytes: 6 %
Neutrophils Absolute: 2.2 10*3/uL (ref 1.4–7.0)
Neutrophils: 45 %
Platelets: 276 10*3/uL (ref 150–450)
RBC: 4.55 x10E6/uL (ref 3.77–5.28)
RDW: 12.9 % (ref 11.7–15.4)
WBC: 4.9 10*3/uL (ref 3.4–10.8)

## 2023-04-30 LAB — VITAMIN D 25 HYDROXY (VIT D DEFICIENCY, FRACTURES): Vit D, 25-Hydroxy: 37.8 ng/mL (ref 30.0–100.0)

## 2023-04-30 LAB — TSH: TSH: 1.13 u[IU]/mL (ref 0.450–4.500)

## 2023-05-04 NOTE — Progress Notes (Signed)
Donna Ford,   Thyroid level looks great.  Vitamin D much better that 2 years ago. You are still low normal and room for some improvement. I would increase by 1000 units a day. Take with diary for better absorption.  Kidney, liver, glucose looks great.   Suprisingly your 10 year risk is still lower at 3.5 percent. Your LDL is very elevated. Goal is under 100 and you are 217.  I know you did not tolerate lipitor but are you willing to try another statin? You need something to decrease this plaque accumulation.   Marland Kitchen.The 10-year ASCVD risk score (Arnett DK, et al., 2019) is: 3.5%   Values used to calculate the score:     Age: 57 years     Sex: Female     Is Non-Hispanic African American: No     Diabetic: No     Tobacco smoker: No     Systolic Blood Pressure: 122 mmHg     Is BP treated: No     HDL Cholesterol: 53 mg/dL     Total Cholesterol: 294 mg/dL

## 2023-06-01 DIAGNOSIS — H40013 Open angle with borderline findings, low risk, bilateral: Secondary | ICD-10-CM | POA: Diagnosis not present

## 2023-06-23 ENCOUNTER — Ambulatory Visit (INDEPENDENT_AMBULATORY_CARE_PROVIDER_SITE_OTHER): Payer: 59 | Admitting: Obstetrics and Gynecology

## 2023-06-23 ENCOUNTER — Encounter: Payer: Self-pay | Admitting: Obstetrics and Gynecology

## 2023-06-23 VITALS — BP 117/71 | HR 79 | Ht 60.0 in | Wt 140.2 lb

## 2023-06-23 DIAGNOSIS — K5901 Slow transit constipation: Secondary | ICD-10-CM

## 2023-06-23 DIAGNOSIS — N8110 Cystocele, unspecified: Secondary | ICD-10-CM

## 2023-06-23 DIAGNOSIS — N952 Postmenopausal atrophic vaginitis: Secondary | ICD-10-CM | POA: Diagnosis not present

## 2023-06-23 DIAGNOSIS — R350 Frequency of micturition: Secondary | ICD-10-CM

## 2023-06-23 LAB — POCT URINALYSIS DIPSTICK
Bilirubin, UA: NEGATIVE
Blood, UA: NEGATIVE
Glucose, UA: NEGATIVE
Ketones, UA: NEGATIVE
Leukocytes, UA: NEGATIVE
Nitrite, UA: NEGATIVE
Protein, UA: NEGATIVE
Spec Grav, UA: 1.02 (ref 1.010–1.025)
Urobilinogen, UA: 0.2 E.U./dL
pH, UA: 7 (ref 5.0–8.0)

## 2023-06-23 NOTE — Patient Instructions (Signed)
Use estrogen cream 2-3 times per week.   We may consider pelvic floor PT in the future.   You will meet with Dr. Florian Buff to discuss if a surgical repair would be a good idea.

## 2023-06-23 NOTE — Progress Notes (Addendum)
[facility] New Patient Evaluation and Consultation  Referring Provider: Nolene Ebbs PCP: Nolene Ebbs Date of Service: 06/23/2023  SUBJECTIVE Chief Complaint: New Patient (Initial Visit) Donna Ford is a 57 y.o. female is here for prolapse.)  History of Present Illness: Donna Ford is a 57 y.o. White or Caucasian female seen in consultation at the request of Dr. Caleen Essex for evaluation of prolapse.  She has previously had prolapse repair in 2021. Reports she has been straining more and feels like things have "dropped" again.   Review of records significant for:  APICAL VAGINAL SUSPENSION N/A 07/30/2020  Procedure: APICAL VAGINAL SUSPENSION to Uterosacral ligament, A&P repair Cystoscopy ; Surgeon: Corena Pilgrim, MD; Location: [facility] MAIN OR; Service: Gynecology; Laterality: N/A;   CYSTOSCOPY N/A 07/30/2020  Procedure: CYSTOSCOPY; Surgeon: Corena Pilgrim, MD; Location: [facility] MAIN OR; Service: Gynecology; Laterality: N/A;   ENDOMETRIAL BIOPSY   ENTEROCELE REPAIR   HYSTERECTOMY  partial   laparascopy with vaginal hysterectomy   rhinologic surgery   Urinary Symptoms: Does not leak urine.    Day time voids 3-5.  Nocturia: 1-2 times per night to void. Voiding dysfunction: she empties her bladder well.  does not use a catheter to empty bladder.  When urinating, she feels she has no difficulties Drinks: 8-16oz Coffee, 32-48 ozWater,8oz  Sweet tea per day  UTIs: 0 UTI's in the last year.   Denies history of blood in urine, kidney or bladder stones, pyelonephritis, bladder cancer, and kidney cancer  Pelvic Organ Prolapse Symptoms:                  She Admits to a feeling of a bulge the vaginal area. It has been present for 4 months.  She Admits to seeing a bulge.  This bulge is not bothersome.  Bowel Symptom: Bowel movements: 3-5 time(s) per week Stool consistency: hard Straining: yes.  Splinting: no.  Incomplete  evacuation: no.  She Denies accidental bowel leakage / fecal incontinence Bowel regimen: fiber Last colonoscopy: Date 2016, Results WNL  Sexual Function Sexually active: yes.  Sexual orientation: Straight Pain with sex: No  Pelvic Pain Denies pelvic pain \   Past Medical History: History reviewed. No pertinent past medical history.   Past Surgical History:   Past Surgical History:  Procedure Laterality Date   PELVIC FLOOR REPAIR     VAGINAL HYSTERECTOMY     Ovaries Retained     Past OB/GYN History: G3 P3 Vaginal deliveries: 3,  Forceps/ Vacuum deliveries: Forceps and Vacuum on first baby, Cesarean section: 0 Menopausal: Yes Last pap smear was Hysterectomy   Medications: She has a current medication list which includes the following prescription(s): escitalopram, estradiol, ondansetron, and scopolamine.   Allergies: Patient is allergic to atorvastatin.   Social History:  Social History   Tobacco Use   Smoking status: Never   Smokeless tobacco: Never  Vaping Use   Vaping status: Never Used  Substance Use Topics   Alcohol use: No    Alcohol/week: 0.0 standard drinks of alcohol   Drug use: No    Relationship status: married She lives with husband.   She is employed as an Char[employer] fundraiser. Regular exercise: Yes: Grandchildren History of abuse: No  Family History:   Family History  Problem Relation Age of Onset   Heart attack Father 30       open heart at 92   Hypertension Father      Review of Systems: Review of Systems  Constitutional:  Negative for chills, fever and malaise/fatigue.  Respiratory:  Negative for cough and shortness of breath.   Cardiovascular:  Negative for chest pain and palpitations.  Gastrointestinal:  Negative for abdominal pain, blood in stool, constipation and diarrhea.  Skin:  Negative for rash.  Neurological:  Negative for dizziness, weakness and headaches.  Endo/Heme/Allergies:  Does not bruise/bleed easily.  Psychiatric/Behavioral:   Negative for depression and suicidal ideas. The patient is not nervous/anxious.      OBJECTIVE Physical Exam: Vitals:   06/23/23 1439  BP: 117/71  Pulse: 79  Weight: 140 lb 3.2 oz (63.6 kg)  Height: 5' (1.524 m)    Physical Exam Pulmonary:     Effort: Pulmonary effort is normal.     Breath sounds: Normal breath sounds.  Skin:    General: Skin is warm and dry.  Neurological:     Mental Status: She is alert and oriented to person, place, and time.  Psychiatric:        Mood and Affect: Mood normal.        Behavior: Behavior normal.        Thought Content: Thought content normal.        Judgment: Judgment normal.      GU / Detailed Urogynecologic Evaluation:  Pelvic Exam: Normal external female genitalia; Bartholin's and Skene's glands normal in appearance; urethral meatus normal in appearance, no urethral masses or discharge.   CST: negative  s/p hysterectomy: Speculum exam reveals normal vaginal mucosa with  atrophy and normal vaginal cuff.  Adnexa normal adnexa.    With apex supported, anterior compartment defect was present  Pelvic floor strength II/V Pelvic floor musculature: Right levator non-tender, Right obturator tender, Left levator non-tender, Left obturator tender  POP-Q:   POP-Q  -0.5                                            Aa   -0.5                                           Ba  -6                                              C   4                                            Gh  3.5                                            Pb  7.5                                            tvl   -2.5  Ap  -2.5                                            Bp                                                 D      Rectal Exam:  Normal sphincter tone, no distal rectocele, enterocoele not present, no rectal masses, noted dyssynergia when asking the patient to bear down.  Post-Void Residual (PVR) by Bladder  Scan: In order to evaluate bladder emptying, we discussed obtaining a postvoid residual and she agreed to this procedure.  Procedure: The ultrasound unit was placed on the patient's abdomen in the suprapubic region after the patient had voided. A PVR of 93 ml was obtained by bladder scan.  Laboratory Results: POC Urine: Negative for all signs of infection  ASSESSMENT AND PLAN Ms. Marcial Pacas is a 57 y.o. with:  1. Prolapse of anterior vaginal wall   2. Vaginal atrophy   3. Slow transit constipation   4. Urinary frequency    Patient reports she had a difficult time healing from her previous prolapse surgery and has a lot of reservations regarding having another one. We discussed trying a pessary as her Anterior vaginal wall prolapse is at a stage II/IV which is not as bad as it originally was. She os open to this. See pessary fitting note. She can follow up with Dr. Florian Buff for more in depth surgical recommendations or to see if she wants to keep the pessary. Patient has been using vaginal estrogen for vaginal atrophy. Encouraged patient to use the estrogen cream at least twice a week, especially while using her pessary.  Patient reports constipation has become more of a problem for her as of late and she feels she is having to strain more. She reports she has been using metamucil. I encouraged her to use Miralax daily instead of metamucil. OAB symptoms not as prominent or bothersome to patient. POC urine negative for infection.   Plan for patient to f/u with surgeon for surgical discussion and pessary check.   Selmer Dominion, NP

## 2023-06-23 NOTE — Progress Notes (Signed)
[facility]   Subjective:     Chief Complaint: New Patient (Initial Visit) Donna Ford is a 57 y.o. female is here for prolapse.)  History of Present Illness: Donna Ford is a 57 y.o. female with stage II pelvic organ prolapse who presents today for a pessary fitting.    Past Medical History: Patient  has no past medical history on file.   Past Surgical History: She  has a past surgical history that includes Total abdominal hysterectomy and Pelvic floor repair.   Medications: She has a current medication list which includes the following prescription(s): escitalopram, estradiol, fluticasone, and montelukast.   Allergies: Patient is allergic to atorvastatin.   Social History: Patient  reports that she has never smoked. She has never used smokeless tobacco. She reports that she does not drink alcohol and does not use drugs.      Objective:    BP 117/71   Pulse 79   Ht 5' (1.524 m)   Wt 140 lb 3.2 oz (63.6 kg)   BMI 27.38 kg/m  Gen: No apparent distress, A&O x 3. Pelvic Exam: Normal external female genitalia; Bartholin's and Skene's glands normal in appearance; urethral meatus normal in appearance, no urethral masses or discharge.   A size #3 ring with support pessary (Lot [id number]) was fitted. It was comfortable, stayed in place with valsalva and was an appropriate size on examination, with one finger fitting between the pessary and the vaginal walls. We tied a string to it and the patient demonstrated proper removal and replacement.    Assessment/Plan:    Assessment: Ms. Donna Ford is a 57 y.o. with stage II pelvic organ prolapse who presents for a pessary fitting. Plan: She was fitted with a #3 ring with support pessary. She will remove once a week . She will use estrogen.   Follow-up in 4 weeks for a pessary check or sooner as needed.  All questions were answered.    Selmer Dominion, NP

## 2023-07-19 ENCOUNTER — Other Ambulatory Visit (HOSPITAL_BASED_OUTPATIENT_CLINIC_OR_DEPARTMENT_OTHER): Payer: Self-pay

## 2023-07-19 ENCOUNTER — Encounter: Payer: Self-pay | Admitting: Physician Assistant

## 2023-07-19 MED ORDER — ONDANSETRON 8 MG PO TBDP
8.0000 mg | ORAL_TABLET | Freq: Three times a day (TID) | ORAL | 1 refills | Status: DC | PRN
  Filled 2023-07-19: qty 20, 7d supply, fill #0

## 2023-07-19 MED ORDER — SCOPOLAMINE 1 MG/3DAYS TD PT72
1.0000 | MEDICATED_PATCH | TRANSDERMAL | 0 refills | Status: DC
  Filled 2023-07-19: qty 2, 6d supply, fill #0

## 2023-07-20 ENCOUNTER — Telehealth: Payer: Self-pay | Admitting: Obstetrics and Gynecology

## 2023-07-20 ENCOUNTER — Encounter: Payer: Self-pay | Admitting: Obstetrics and Gynecology

## 2023-07-20 NOTE — Telephone Encounter (Signed)
FYI- patient does not want to keep follow up appt. Says insurnance didn't pay much on last appt. Can't afford. Pessary working well. Does not want surgery at this time.

## 2023-07-21 ENCOUNTER — Ambulatory Visit: Payer: 59 | Admitting: Obstetrics and Gynecology

## 2023-07-26 ENCOUNTER — Encounter: Payer: Self-pay | Admitting: Obstetrics and Gynecology

## 2023-07-26 ENCOUNTER — Other Ambulatory Visit (HOSPITAL_BASED_OUTPATIENT_CLINIC_OR_DEPARTMENT_OTHER): Payer: Self-pay

## 2023-08-03 ENCOUNTER — Other Ambulatory Visit (HOSPITAL_COMMUNITY): Payer: Self-pay

## 2023-08-12 ENCOUNTER — Encounter: Payer: Self-pay | Admitting: Emergency Medicine

## 2023-08-12 ENCOUNTER — Other Ambulatory Visit (HOSPITAL_BASED_OUTPATIENT_CLINIC_OR_DEPARTMENT_OTHER): Payer: Self-pay

## 2023-08-12 ENCOUNTER — Ambulatory Visit
Admission: EM | Admit: 2023-08-12 | Discharge: 2023-08-12 | Disposition: A | Discharge status: Home / Self Care | Payer: 59 | Attending: Family Medicine | Admitting: Family Medicine

## 2023-08-12 DIAGNOSIS — J0191 Acute recurrent sinusitis, unspecified: Secondary | ICD-10-CM | POA: Diagnosis not present

## 2023-08-12 DIAGNOSIS — J3089 Other allergic rhinitis: Secondary | ICD-10-CM

## 2023-08-12 MED ORDER — AMOXICILLIN-POT CLAVULANATE 875-125 MG PO TABS
1.0000 | ORAL_TABLET | Freq: Two times a day (BID) | ORAL | 0 refills | Status: DC
  Filled 2023-08-12: qty 14, 7d supply, fill #0

## 2023-08-12 MED ORDER — PREDNISONE 50 MG PO TABS
ORAL_TABLET | ORAL | 0 refills | Status: DC
  Filled 2023-08-12: qty 5, 5d supply, fill #0

## 2023-08-12 NOTE — ED Triage Notes (Signed)
Patient c/o congestion, cough, sinus pressure x 1 week.  Afebrile.  Patient has taken Singulair, Sudafed and Claritin.

## 2023-08-12 NOTE — Discharge Instructions (Signed)
Be sure you are drinking lots of fluids Take prednisone once a day for 5 days Take the Augmentin antibiotic twice a day.  Consider taking a probiotic while on the antibiotic Call for problems

## 2023-08-12 NOTE — ED Provider Notes (Signed)
[facility]    CSN: [id number] Arrival date & time: 08/12/23  1719      History   Chief Complaint Chief Complaint  Patient presents with   Cough    HPI Donna Ford is a 57 y.o. female.   Patient has allergies.  She has recurring sinus infections.  She currently has had some sinus symptoms for over a week.  He has been taking over-the-counter medicines.  In spite of this her sinus drainage and postnasal drip has now gone down into her chest.  She is starting to cough.  Coughing up phlegm and mucus.  She is concerned regarding her worsening symptoms.  She is concerned because she has a cruise scheduled for next week.  Her husband is on antibiotics for sinus infection and improving    History reviewed. No pertinent past medical history.  Patient Active Problem List   Diagnosis Date Noted   Acute right-sided low back pain without sciatica 01/09/2022   No energy 12/20/2020   Female bladder prolapse 05/28/2020   Dyslipidemia 05/12/2018   Reactive airway disease 04/12/2018   Cough 04/07/2018   SOB (shortness of breath) 04/07/2018   Recurrent major depressive disorder, in remission (HCC) 05/12/2017   Trigger finger of both hands 05/12/2017   Onychomycosis due to dermatophyte 06/02/2016   Hyperlipemia 10/10/2015   Rhinitis, allergic 08/07/2015   Post menopausal problems 08/07/2015   Nasal congestion 08/07/2015   Rectal pain 10/22/2014   Esophageal reflux 10/19/2014   External hemorrhoid 10/19/2014   MVP (mitral valve prolapse) 09/09/2012    Past Surgical History:  Procedure Laterality Date   PELVIC FLOOR REPAIR     VAGINAL HYSTERECTOMY     Ovaries Retained    OB History     Gravida  3   Para  3   Term  3   Preterm      AB      Living  3      SAB      IAB      Ectopic      Multiple      Live Births               Home Medications    Prior to Admission medications   Medication Sig Start Date End Date Taking? Authorizing Provider   amoxicillin-clavulanate (AUGMENTIN) 875-125 MG tablet Take 1 tablet by mouth every 12 (twelve) hours. 08/12/23  Yes Eustace Moore, MD  escitalopram (LEXAPRO) 10 MG tablet Take 1 tablet (10 mg total) by mouth daily. 04/07/23 04/01/24 Yes Breeback, Jade L, PA-C  estradiol (ESTRACE) 0.1 MG/GM vaginal cream Place 2 grams vaginally every other day. 04/07/23  Yes Breeback, Jade L, PA-C  ondansetron (ZOFRAN-ODT) 8 MG disintegrating tablet Take 1 tablet (8 mg total) by mouth every 8 (eight) hours as needed. 07/19/23  Yes Breeback, Jade L, PA-C  predniSONE (DELTASONE) 50 MG tablet Take 1 tablet (50mg ) by mouth with food once a day for 5 days. 08/12/23  Yes Eustace Moore, MD  scopolamine (TRANSDERM-SCOP) 1 MG/3DAYS Place 1 patch (1.5 mg total) onto the skin every 3 (three) days. 07/19/23  Yes Breeback, Lonna [facility], PA-C    Family History Family History  Problem Relation Age of Onset   Heart attack Father 75       open heart at 65   Hypertension Father     Social History Social History   Tobacco Use   Smoking status: Never   Smokeless tobacco: Never  Vaping  Use   Vaping status: Never Used  Substance Use Topics   Alcohol use: No    Alcohol/week: 0.0 standard drinks of alcohol   Drug use: No     Allergies   Atorvastatin   Review of Systems Review of Systems See HPI  Physical Exam Triage Vital Signs ED Triage Vitals  Encounter Vitals Group     BP 08/12/23 1730 133/88     Systolic BP Percentile --      Diastolic BP Percentile --      Pulse Rate 08/12/23 1730 77     Resp 08/12/23 1730 16     Temp 08/12/23 1730 98.1 F (36.7 C)     Temp Source 08/12/23 1730 Oral     SpO2 08/12/23 1730 99 %     Weight 08/12/23 1733 137 lb (62.1 kg)     Height 08/12/23 1733 5\' 1"  (1.549 m)     Head Circumference --      Peak Flow --      Pain Score 08/12/23 1732 0     Pain Loc --      Pain Education --      Exclude from Growth Chart --    No data found.  Updated Vital Signs BP 133/88 (BP  Location: Right Arm)   Pulse 77   Temp 98.1 F (36.7 C) (Oral)   Resp 16   Ht 5\' 1"  (1.549 m)   Wt 62.1 kg   SpO2 99%   BMI 25.89 kg/m        Physical Exam Constitutional:      General: She is not in acute distress.    Appearance: She is well-developed. She is ill-appearing.  HENT:     Head: Normocephalic and atraumatic.     Right Ear: Tympanic membrane and ear canal normal.     Left Ear: Tympanic membrane and ear canal normal.     Nose: Congestion and rhinorrhea present.     Mouth/Throat:     Pharynx: Posterior oropharyngeal erythema present.  Eyes:     Conjunctiva/sclera: Conjunctivae normal.     Pupils: Pupils are equal, round, and reactive to light.  Cardiovascular:     Rate and Rhythm: Normal rate and regular rhythm.     Heart sounds: Normal heart sounds.  Pulmonary:     Effort: Pulmonary effort is normal. No respiratory distress.     Breath sounds: Rhonchi present.  Abdominal:     General: There is no distension.     Palpations: Abdomen is soft.  Musculoskeletal:        General: Normal range of motion.     Cervical back: Normal range of motion and neck supple.  Skin:    General: Skin is warm and dry.  Neurological:     Mental Status: She is alert.      UC Treatments / Results  Labs (all labs ordered are listed, but only abnormal results are displayed) Labs Reviewed - No data to display  EKG   Radiology No results found.  Procedures Procedures (including critical care time)  Medications Ordered in UC Medications - No data to display  Initial Impression / Assessment and Plan / UC Course  I have reviewed the triage vital signs and the nursing notes.  Pertinent labs & imaging results that were available during my care of the patient were reviewed by me and considered in my medical decision making (see chart for details).     Since patient is having worsening symptoms  8 days into her illness I think an antibiotic is reasonable.  Continue usual  allergy medicines Final Clinical Impressions(s) / UC Diagnoses   Final diagnoses:  Acute recurrent sinusitis, unspecified location     Discharge Instructions      Be sure you are drinking lots of fluids Take prednisone once a day for 5 days Take the Augmentin antibiotic twice a day.  Consider taking a probiotic while on the antibiotic Call for problems   ED Prescriptions     Medication Sig Dispense Auth. Provider   amoxicillin-clavulanate (AUGMENTIN) 875-125 MG tablet Take 1 tablet by mouth every 12 (twelve) hours. 14 tablet Eustace Moore, MD   predniSONE (DELTASONE) 50 MG tablet Take 1 tablet (50mg ) by mouth with food once a day for 5 days. 5 tablet Eustace Moore, MD      PDMP not reviewed this encounter.   Eustace Moore, MD 08/12/23 [phone]

## 2023-11-03 ENCOUNTER — Other Ambulatory Visit (HOSPITAL_COMMUNITY): Payer: Self-pay

## 2023-11-03 ENCOUNTER — Other Ambulatory Visit (HOSPITAL_BASED_OUTPATIENT_CLINIC_OR_DEPARTMENT_OTHER): Payer: Self-pay

## 2023-11-12 ENCOUNTER — Other Ambulatory Visit: Payer: Self-pay | Admitting: Physician Assistant

## 2023-11-12 DIAGNOSIS — Z1231 Encounter for screening mammogram for malignant neoplasm of breast: Secondary | ICD-10-CM

## 2023-11-24 ENCOUNTER — Other Ambulatory Visit: Payer: Self-pay

## 2023-11-24 ENCOUNTER — Other Ambulatory Visit (HOSPITAL_BASED_OUTPATIENT_CLINIC_OR_DEPARTMENT_OTHER): Payer: Self-pay

## 2023-11-24 ENCOUNTER — Telehealth: Payer: Self-pay

## 2023-11-24 ENCOUNTER — Ambulatory Visit
Admission: EM | Admit: 2023-11-24 | Discharge: 2023-11-24 | Disposition: A | Discharge status: Home / Self Care | Payer: 59 | Attending: Family Medicine | Admitting: Family Medicine

## 2023-11-24 DIAGNOSIS — R059 Cough, unspecified: Secondary | ICD-10-CM | POA: Diagnosis not present

## 2023-11-24 DIAGNOSIS — H5203 Hypermetropia, bilateral: Secondary | ICD-10-CM | POA: Diagnosis not present

## 2023-11-24 DIAGNOSIS — J069 Acute upper respiratory infection, unspecified: Secondary | ICD-10-CM

## 2023-11-24 DIAGNOSIS — H52221 Regular astigmatism, right eye: Secondary | ICD-10-CM | POA: Diagnosis not present

## 2023-11-24 DIAGNOSIS — H524 Presbyopia: Secondary | ICD-10-CM | POA: Diagnosis not present

## 2023-11-24 MED ORDER — PREDNISONE 10 MG PO TABS
ORAL_TABLET | ORAL | 0 refills | Status: DC
  Filled 2023-11-24: qty 42, fill #0

## 2023-11-24 MED ORDER — HYDROCODONE BIT-HOMATROP MBR 5-1.5 MG/5ML PO SOLN
5.0000 mL | Freq: Four times a day (QID) | ORAL | 0 refills | Status: DC | PRN
  Filled 2023-11-24: qty 120, 6d supply, fill #0

## 2023-11-24 MED ORDER — AMOXICILLIN-POT CLAVULANATE 875-125 MG PO TABS
1.0000 | ORAL_TABLET | Freq: Two times a day (BID) | ORAL | 0 refills | Status: DC
  Filled 2023-11-24: qty 20, 10d supply, fill #0

## 2023-11-24 MED ORDER — BENZONATATE 200 MG PO CAPS
200.0000 mg | ORAL_CAPSULE | Freq: Three times a day (TID) | ORAL | 0 refills | Status: DC | PRN
  Filled 2023-11-24: qty 40, 14d supply, fill #0

## 2023-11-24 MED ORDER — PREDNISONE 50 MG PO TABS
50.0000 mg | ORAL_TABLET | Freq: Every day | ORAL | 0 refills | Status: DC
  Filled 2023-11-24: qty 5, 5d supply, fill #0

## 2023-11-24 MED ORDER — BENZONATATE 200 MG PO CAPS
200.0000 mg | ORAL_CAPSULE | Freq: Three times a day (TID) | ORAL | 0 refills | Status: AC | PRN
  Filled 2023-11-24: qty 40, 14d supply, fill #0

## 2023-11-24 MED ORDER — AMOXICILLIN-POT CLAVULANATE 875-125 MG PO TABS
1.0000 | ORAL_TABLET | Freq: Two times a day (BID) | ORAL | 0 refills | Status: DC
  Filled 2023-11-24: qty 14, 7d supply, fill #0

## 2023-11-24 NOTE — ED Provider Notes (Signed)
[facility]    CSN: [id number] Arrival date & time: 11/24/23  0806      History   Chief Complaint Chief Complaint  Patient presents with   Cough    HPI Donna Ford is a 57 y.o. female.   HPI pleasant 57 year old female presents with cough for 4 days and wheezing last night.  Reports using Delsym and Mucinex as needed.  PMH significant for reactive airway disease, dyslipidemia, and esophageal reflux.  History reviewed. No pertinent past medical history.  Patient Active Problem List   Diagnosis Date Noted   Acute right-sided low back pain without sciatica 01/09/2022   No energy 12/20/2020   Female bladder prolapse 05/28/2020   Dyslipidemia 05/12/2018   Reactive airway disease 04/12/2018   Cough 04/07/2018   SOB (shortness of breath) 04/07/2018   Recurrent major depressive disorder, in remission (HCC) 05/12/2017   Trigger finger of both hands 05/12/2017   Onychomycosis due to dermatophyte 06/02/2016   Hyperlipemia 10/10/2015   Rhinitis, allergic 08/07/2015   Post menopausal problems 08/07/2015   Nasal congestion 08/07/2015   Rectal pain 10/22/2014   Esophageal reflux 10/19/2014   External hemorrhoid 10/19/2014   MVP (mitral valve prolapse) 09/09/2012    Past Surgical History:  Procedure Laterality Date   PELVIC FLOOR REPAIR     VAGINAL HYSTERECTOMY     Ovaries Retained    OB History     Gravida  3   Para  3   Term  3   Preterm      AB      Living  3      SAB      IAB      Ectopic      Multiple      Live Births               Home Medications    Prior to Admission medications   Medication Sig Start Date End Date Taking? Authorizing Provider  amoxicillin-clavulanate (AUGMENTIN) 875-125 MG tablet Take 1 tablet by mouth every 12 (twelve) hours. 11/24/23  Yes Trevor Iha, FNP  benzonatate (TESSALON) 200 MG capsule Take 1 capsule (200 mg total) by mouth 3 (three) times daily as needed for up to 7 days. 11/24/23 12/08/23 Yes  Trevor Iha, FNP  predniSONE (DELTASONE) 50 MG tablet Take 1 tablet (50 mg total) by mouth daily for 5 days. 11/24/23  Yes Trevor Iha, FNP  escitalopram (LEXAPRO) 10 MG tablet Take 1 tablet (10 mg total) by mouth daily. 04/07/23 04/01/24  Jomarie Longs, PA-C  estradiol (ESTRACE) 0.1 MG/GM vaginal cream Place 2 grams vaginally every other day. 04/07/23   Breeback, Jade L, PA-C  ondansetron (ZOFRAN-ODT) 8 MG disintegrating tablet Take 1 tablet (8 mg total) by mouth every 8 (eight) hours as needed. 07/19/23   Breeback, Lonna Cobb, PA-C  scopolamine (TRANSDERM-SCOP) 1 MG/3DAYS Place 1 patch (1.5 mg total) onto the skin every 3 (three) days. 07/19/23   Jomarie Longs, PA-C    Family History Family History  Problem Relation Age of Onset   Heart attack Father 15       open heart at 1   Hypertension Father     Social History Social History   Tobacco Use   Smoking status: Never   Smokeless tobacco: Never  Vaping Use   Vaping status: Never Used  Substance Use Topics   Alcohol use: No    Alcohol/week: 0.0 standard drinks of alcohol   Drug use: No  Allergies   Atorvastatin   Review of Systems Review of Systems  HENT:  Positive for congestion, sinus pressure and sinus pain.   Respiratory:  Positive for cough and wheezing.   All other systems reviewed and are negative.    Physical Exam Triage Vital Signs ED Triage Vitals  Encounter Vitals Group     BP 11/24/23 0827 (!) 146/75     Systolic BP Percentile --      Diastolic BP Percentile --      Pulse Rate 11/24/23 0827 81     Resp 11/24/23 0827 17     Temp 11/24/23 0827 98.8 F (37.1 C)     Temp Source 11/24/23 0827 Oral     SpO2 11/24/23 0827 95 %     Weight --      Height --      Head Circumference --      Peak Flow --      Pain Score 11/24/23 0828 0     Pain Loc --      Pain Education --      Exclude from Growth Chart --    No data found.  Updated Vital Signs BP (!) 146/75 (BP Location: Right Arm)   Pulse  81   Temp 98.8 F (37.1 C) (Oral)   Resp 17   SpO2 95%    Physical Exam Vitals and nursing note reviewed.  Constitutional:      General: She is not in acute distress.    Appearance: Normal appearance. She is normal weight. She is ill-appearing. She is not toxic-appearing or diaphoretic.  HENT:     Head: Normocephalic and atraumatic.     Right Ear: Tympanic membrane, ear canal and external ear normal.     Left Ear: Tympanic membrane, ear canal and external ear normal.     Mouth/Throat:     Mouth: Mucous membranes are moist.     Pharynx: Oropharynx is clear.  Eyes:     Extraocular Movements: Extraocular movements intact.     Conjunctiva/sclera: Conjunctivae normal.     Pupils: Pupils are equal, round, and reactive to light.  Cardiovascular:     Rate and Rhythm: Normal rate and regular rhythm.     Pulses: Normal pulses.     Heart sounds: Normal heart sounds.  Pulmonary:     Effort: Pulmonary effort is normal.     Breath sounds: Normal breath sounds. No wheezing, rhonchi or rales.     Comments: Frequent nonproductive cough on exam Musculoskeletal:        General: Normal range of motion.     Cervical back: Normal range of motion and neck supple.  Skin:    General: Skin is warm and dry.  Neurological:     General: No focal deficit present.     Mental Status: She is alert and oriented to person, place, and time. Mental status is at baseline.  Psychiatric:        Mood and Affect: Mood normal.        Behavior: Behavior normal.      UC Treatments / Results  Labs (all labs ordered are listed, but only abnormal results are displayed) Labs Reviewed - No data to display  EKG   Radiology No results found.  Procedures Procedures (including critical care time)  Medications Ordered in UC Medications - No data to display  Initial Impression / Assessment and Plan / UC Course  I have reviewed the triage vital signs and the nursing notes.  Pertinent labs & imaging results  that were available during my care of the patient were reviewed by me and considered in my medical decision making (see chart for details).     MDM: 1.  Acute upper respiratory infection-Rx'd Augmentin 875/125 mg tablet: Take 1 tablet twice daily x 7 days; 2.  Cough, unspecified type-Rx'd prednisone 50 mg: Take 1 tablet daily x 5 days, Rx'd Tessalon 200 mg capsule: Take 1 capsule 3 times daily, as needed. Advised patient to take medications as directed with food to completion.  Advised to take prednisone with first dose of Augmentin for the next 5 of 7 days.  Advised may use Tessalon capsules daily or as needed for cough.  Encouraged increase daily water intake to 64 ounces per day while taking these medications.  Advised if symptoms worsen and/or unresolved please follow-up PCP or here for further evaluation. Final Clinical Impressions(s) / UC Diagnoses   Final diagnoses:  Cough, unspecified type  Acute upper respiratory infection     Discharge Instructions      Advised patient to take medications as directed with food to completion.  Advised to take prednisone with first dose of Augmentin for the next 5 of 7 days.  Advised may use Tessalon capsules daily or as needed for cough.  Encouraged increase daily water intake to 64 ounces per day while taking these medications.  Advised if symptoms worsen and/or unresolved please follow-up PCP or here for further evaluation.     ED Prescriptions     Medication Sig Dispense Auth. Provider   amoxicillin-clavulanate (AUGMENTIN) 875-125 MG tablet Take 1 tablet by mouth every 12 (twelve) hours. 14 tablet Trevor Iha, FNP   predniSONE (DELTASONE) 50 MG tablet Take 1 tablet (50 mg total) by mouth daily for 5 days. 5 tablet Trevor Iha, FNP   benzonatate (TESSALON) 200 MG capsule Take 1 capsule (200 mg total) by mouth 3 (three) times daily as needed for up to 7 days. 40 capsule Trevor Iha, FNP   amoxicillin-clavulanate (AUGMENTIN) 875-125 MG  tablet Take 1 tablet by mouth 2 (two) times daily for 10 days. 20 tablet Trevor Iha, FNP   predniSONE (DELTASONE) 10 MG tablet Take by mouth daily. Take 6 tabs by mouth daily  for 2 days, then 5 tabs for 2 days, then 4 tabs for 2 days, then 3 tabs for 2 days, 2 tabs for 2 days, then 1 tab by mouth daily for 2 days 42 tablet Trevor Iha, FNP   benzonatate (TESSALON) 200 MG capsule Take 1 capsule (200 mg total) by mouth 3 (three) times daily as needed for up to 7 days. 40 capsule Trevor Iha, FNP   HYDROcodone bit-homatropine (HYCODAN) 5-1.5 MG/5ML syrup Take 5 mLs by mouth every 6 (six) hours as needed for cough. 120 mL Trevor Iha, FNP      I have reviewed the PDMP during this encounter.   Trevor Iha, FNP 11/24/23 [phone]

## 2023-11-24 NOTE — Discharge Instructions (Addendum)
Advised patient to take medications as directed with food to completion.  Advised to take prednisone with first dose of Augmentin for the next 5 of 7 days.  Advised may use Tessalon capsules daily or as needed for cough.  Encouraged increase daily water intake to 64 ounces per day while taking these medications.  Advised if symptoms worsen and/or unresolved please follow-up PCP or here for further evaluation.

## 2023-11-24 NOTE — ED Triage Notes (Signed)
Pt c/o cough x 4 days. Wheezing last night. Denies fever. Delsym and mucinex prn.

## 2023-11-25 ENCOUNTER — Other Ambulatory Visit (HOSPITAL_BASED_OUTPATIENT_CLINIC_OR_DEPARTMENT_OTHER): Payer: Self-pay

## 2023-11-25 ENCOUNTER — Other Ambulatory Visit: Payer: Self-pay | Admitting: Physician Assistant

## 2023-11-25 DIAGNOSIS — J4520 Mild intermittent asthma, uncomplicated: Secondary | ICD-10-CM

## 2023-11-25 MED ORDER — MONTELUKAST SODIUM 10 MG PO TABS
10.0000 mg | ORAL_TABLET | Freq: Every day | ORAL | 11 refills | Status: DC
  Filled 2023-11-25: qty 90, 90d supply, fill #0

## 2024-01-03 ENCOUNTER — Encounter: Payer: Self-pay | Admitting: Physician Assistant

## 2024-01-04 ENCOUNTER — Other Ambulatory Visit (HOSPITAL_COMMUNITY): Payer: Self-pay

## 2024-01-04 MED ORDER — FLUCONAZOLE 150 MG PO TABS
ORAL_TABLET | ORAL | 0 refills | Status: DC
  Filled 2024-01-04: qty 3, 3d supply, fill #0

## 2024-01-05 ENCOUNTER — Ambulatory Visit: Payer: 59

## 2024-01-05 DIAGNOSIS — Z1231 Encounter for screening mammogram for malignant neoplasm of breast: Secondary | ICD-10-CM | POA: Diagnosis not present

## 2024-01-07 ENCOUNTER — Encounter: Payer: Self-pay | Admitting: Physician Assistant

## 2024-01-07 NOTE — Progress Notes (Signed)
 Normal mammogram. Follow up in 1 year.

## 2024-04-26 ENCOUNTER — Other Ambulatory Visit (HOSPITAL_BASED_OUTPATIENT_CLINIC_OR_DEPARTMENT_OTHER): Payer: Self-pay

## 2024-04-26 ENCOUNTER — Ambulatory Visit
Admission: EM | Admit: 2024-04-26 | Discharge: 2024-04-26 | Disposition: A | Discharge status: Home / Self Care | Attending: Family Medicine | Admitting: Family Medicine

## 2024-04-26 DIAGNOSIS — R059 Cough, unspecified: Secondary | ICD-10-CM

## 2024-04-26 DIAGNOSIS — J0100 Acute maxillary sinusitis, unspecified: Secondary | ICD-10-CM | POA: Diagnosis not present

## 2024-04-26 DIAGNOSIS — J309 Allergic rhinitis, unspecified: Secondary | ICD-10-CM

## 2024-04-26 MED ORDER — PREDNISONE 20 MG PO TABS
60.0000 mg | ORAL_TABLET | Freq: Every day | ORAL | 0 refills | Status: DC
  Filled 2024-04-26: qty 15, 5d supply, fill #0

## 2024-04-26 MED ORDER — AMOXICILLIN-POT CLAVULANATE 875-125 MG PO TABS
1.0000 | ORAL_TABLET | Freq: Two times a day (BID) | ORAL | 0 refills | Status: DC
  Filled 2024-04-26: qty 14, 7d supply, fill #0

## 2024-04-26 MED ORDER — FEXOFENADINE HCL 180 MG PO TABS
180.0000 mg | ORAL_TABLET | Freq: Every day | ORAL | 0 refills | Status: DC
  Filled 2024-04-26: qty 30, 30d supply, fill #0

## 2024-04-26 NOTE — ED Triage Notes (Addendum)
 Pt c/o cough and sinus congestion since Sat. Worsening in last 24 hours. Taking sudafed, afrin and nyquil prn. Hx of sinus infections.

## 2024-04-26 NOTE — Discharge Instructions (Addendum)
 Advised patient to take medications as directed with food to completion.  Advised to take prednisone  and Allegra with first dose of Augmentin  for the next 5 of 7 days.  May use Allegra as needed afterwards for concurrent postnasal drainage/drip/runny nose.  Encouraged to increase daily water intake to 64 ounces per day while taking these medications.  Advised if symptoms worsen and/or unresolved please follow-up with your PCP or here for further evaluation.

## 2024-04-26 NOTE — ED Provider Notes (Signed)
 [facility]    CSN: [id number] Arrival date & time: 04/26/24  0801      History   Chief Complaint Chief Complaint  Patient presents with   Cough   Nasal Congestion    HPI Donna Ford is a 58 y.o. female.   HPI Pleasant 58 year old female presents with cough and sinus and nasal congestion for 7-8 days.  Patient is concerned with possibility of sinus infection and reports history of sinus infection.  PMH significant for reactive airway disease, dyslipidemia, and female bladder prolapse.  History reviewed. No pertinent past medical history.  Patient Active Problem List   Diagnosis Date Noted   Acute right-sided low back pain without sciatica 01/09/2022   No energy 12/20/2020   Female bladder prolapse 05/28/2020   Dyslipidemia 05/12/2018   Reactive airway disease 04/12/2018   Cough 04/07/2018   SOB (shortness of breath) 04/07/2018   Recurrent major depressive disorder, in remission (HCC) 05/12/2017   Trigger finger of both hands 05/12/2017   Onychomycosis due to dermatophyte 06/02/2016   Hyperlipemia 10/10/2015   Rhinitis, allergic 08/07/2015   Post menopausal problems 08/07/2015   Nasal congestion 08/07/2015   Rectal pain 10/22/2014   Esophageal reflux 10/19/2014   External hemorrhoid 10/19/2014   MVP (mitral valve prolapse) 09/09/2012    Past Surgical History:  Procedure Laterality Date   PELVIC FLOOR REPAIR     VAGINAL HYSTERECTOMY     Ovaries Retained    OB History     Gravida  3   Para  3   Term  3   Preterm      AB      Living  3      SAB      IAB      Ectopic      Multiple      Live Births               Home Medications    Prior to Admission medications   Medication Sig Start Date End Date Taking? Authorizing Provider  amoxicillin -clavulanate (AUGMENTIN ) 875-125 MG tablet Take 1 tablet by mouth every 12 (twelve) hours. 04/26/24  Yes Leonides Ramp, FNP  fexofenadine [facility] ALLERGY) 180 MG tablet Take 1 tablet  (180 mg total) by mouth daily for 15 days. 04/26/24 05/11/24 Yes Leonides Ramp, FNP  predniSONE  (DELTASONE ) 20 MG tablet Take 3 tablets (60 mg total) by mouth daily for 5 days. 04/26/24  Yes Leonides Ramp, FNP  escitalopram  (LEXAPRO ) 10 MG tablet Take 1 tablet (10 mg total) by mouth daily. 04/07/23 05/22/24  Breeback, Jade L, PA-C  estradiol  (ESTRACE ) 0.1 MG/GM vaginal cream Place 2 grams vaginally every other day. 04/07/23   Breeback, Jade L, PA-C  fluconazole  (DIFLUCAN ) 150 MG tablet Take one tablet by mouth and repeat in 48 to 72 hours if symptoms persist. 01/04/24   Breeback, Jade L, PA-C  montelukast  (SINGULAIR ) 10 MG tablet Take 1 tablet (10 mg total) by mouth at bedtime. 11/25/23 11/24/24  Breeback, Jade L, PA-C  ondansetron  (ZOFRAN -ODT) 8 MG disintegrating tablet Take 1 tablet (8 mg total) by mouth every 8 (eight) hours as needed. 07/19/23   Breeback, Jade L, PA-C  scopolamine  (TRANSDERM-SCOP) 1 MG/3DAYS Place 1 patch (1.5 mg total) onto the skin every 3 (three) days. 07/19/23   Breeback, Jade L, PA-C    Family History Family History  Problem Relation Age of Onset   Heart attack Father 30       open heart at 20  Hypertension Father     Social History Social History   Tobacco Use   Smoking status: Never   Smokeless tobacco: Never  Vaping Use   Vaping status: Never Used  Substance Use Topics   Alcohol use: No    Alcohol/week: 0.0 standard drinks of alcohol   Drug use: No     Allergies   Atorvastatin    Review of Systems Review of Systems  HENT:  Positive for congestion, postnasal drip and rhinorrhea.   Respiratory:  Positive for cough.   All other systems reviewed and are negative.    Physical Exam Triage Vital Signs ED Triage Vitals  Encounter Vitals Group     BP      Systolic BP Percentile      Diastolic BP Percentile      Pulse      Resp      Temp      Temp src      SpO2      Weight      Height      Head Circumference      Peak Flow      Pain Score       Pain Loc      Pain Education      Exclude from Growth Chart    No data found.  Updated Vital Signs BP 122/78 (BP Location: Right Arm)   Pulse 84   Temp 99 F (37.2 C) (Oral)   Resp 17   SpO2 96%       Physical Exam Vitals and nursing note reviewed.  Constitutional:      Appearance: Normal appearance. She is normal weight. She is ill-appearing.  HENT:     Head: Normocephalic and atraumatic.     Right Ear: Tympanic membrane, ear canal and external ear normal.     Left Ear: Tympanic membrane, ear canal and external ear normal.     Mouth/Throat:     Mouth: Mucous membranes are moist.     Pharynx: Oropharynx is clear.     Comments: Significant amount of clear drainage of posterior oropharynx noted Eyes:     Extraocular Movements: Extraocular movements intact.     Conjunctiva/sclera: Conjunctivae normal.     Pupils: Pupils are equal, round, and reactive to light.  Cardiovascular:     Rate and Rhythm: Normal rate and regular rhythm.     Pulses: Normal pulses.     Heart sounds: Normal heart sounds.  Pulmonary:     Effort: Pulmonary effort is normal.     Breath sounds: Normal breath sounds. No wheezing, rhonchi or rales.     Comments: Infrequent nonproductive cough on exam Musculoskeletal:        General: Normal range of motion.     Cervical back: Normal range of motion and neck supple.  Skin:    General: Skin is warm and dry.  Neurological:     General: No focal deficit present.     Mental Status: She is alert and oriented to person, place, and time. Mental status is at baseline.  Psychiatric:        Mood and Affect: Mood normal.        Behavior: Behavior normal.      UC Treatments / Results  Labs (all labs ordered are listed, but only abnormal results are displayed) Labs Reviewed - No data to display  EKG   Radiology No results found.  Procedures Procedures (including critical care time)  Medications Ordered in UC Medications -  No data to  display  Initial Impression / Assessment and Plan / UC Course  I have reviewed the triage vital signs and the nursing notes.  Pertinent labs & imaging results that were available during my care of the patient were reviewed by me and considered in my medical decision making (see chart for details).     MDM: 1.  Acute maxillary sinusitis, recurrence not specified-Rx'd Augmentin  875/125 mg tablet: Take 1 tablet twice daily x 7 days; 2.  Cough, unspecified type-Rx prednisone  20 mg tablet: Take 3 tablets p.o. daily x 5 days; 3.  Allergic rhinitis, unspecified seasonality, unspecified trigger-Rx'd Allegra 180 mg tablet: Take 1 tablet daily x 5 days, then as needed. Advised patient to take medications as directed with food to completion.  Advised to take prednisone  and Allegra with first dose of Augmentin  for the next 5 of 7 days.  May use Allegra as needed afterwards for concurrent postnasal drainage/drip/runny nose.  Encouraged to increase daily water intake to 64 ounces per day while taking these medications.  Advised if symptoms worsen and/or unresolved please follow-up with your PCP or here for further evaluation.  Patient discharged home, hemodynamically stable. Final Clinical Impressions(s) / UC Diagnoses   Final diagnoses:  Acute maxillary sinusitis, recurrence not specified  Allergic rhinitis, unspecified seasonality, unspecified trigger  Cough, unspecified type     Discharge Instructions      Advised patient to take medications as directed with food to completion.  Advised to take prednisone  and Allegra with first dose of Augmentin  for the next 5 of 7 days.  May use Allegra as needed afterwards for concurrent postnasal drainage/drip/runny nose.  Encouraged to increase daily water intake to 64 ounces per day while taking these medications.  Advised if symptoms worsen and/or unresolved please follow-up with your PCP or here for further evaluation.   ED Prescriptions     Medication Sig  Dispense Auth. Provider   amoxicillin -clavulanate (AUGMENTIN ) 875-125 MG tablet Take 1 tablet by mouth every 12 (twelve) hours. 14 tablet Samarah Hogle, FNP   predniSONE  (DELTASONE ) 20 MG tablet Take 3 tablets (60 mg total) by mouth daily for 5 days. 15 tablet Gwyneth Fernandez, FNP   fexofenadine (ALLEGRA ALLERGY) 180 MG tablet Take 1 tablet (180 mg total) by mouth daily for 15 days. 15 tablet Amatullah Christy, FNP      PDMP not reviewed this encounter.   Leonides Ramp, FNP 04/26/24 [phone]

## 2024-05-25 ENCOUNTER — Encounter (HOSPITAL_BASED_OUTPATIENT_CLINIC_OR_DEPARTMENT_OTHER): Payer: Self-pay

## 2024-05-25 ENCOUNTER — Other Ambulatory Visit: Payer: Self-pay | Admitting: Physician Assistant

## 2024-05-25 ENCOUNTER — Other Ambulatory Visit (HOSPITAL_BASED_OUTPATIENT_CLINIC_OR_DEPARTMENT_OTHER): Payer: Self-pay

## 2024-05-25 DIAGNOSIS — F3340 Major depressive disorder, recurrent, in remission, unspecified: Secondary | ICD-10-CM

## 2024-05-26 ENCOUNTER — Other Ambulatory Visit (HOSPITAL_BASED_OUTPATIENT_CLINIC_OR_DEPARTMENT_OTHER): Payer: Self-pay

## 2024-05-26 ENCOUNTER — Telehealth: Payer: Self-pay

## 2024-05-26 DIAGNOSIS — F3340 Major depressive disorder, recurrent, in remission, unspecified: Secondary | ICD-10-CM

## 2024-05-26 MED ORDER — ESCITALOPRAM OXALATE 10 MG PO TABS
10.0000 mg | ORAL_TABLET | Freq: Every day | ORAL | 0 refills | Status: DC
  Filled 2024-05-26: qty 90, 90d supply, fill #0

## 2024-05-26 NOTE — Telephone Encounter (Signed)
 Copied from CRM [id number]. Topic: Clinical - Medication Question >> May 26, 2024  8:19 AM Georgeann Kindred wrote: Reason for CRM: Patient states they are going out of town on 06/21 and is wanting to an emergency prescription sent in for the Rx #: [id number]  escitalopram  (LEXAPRO ) 10 MG tablet to get her through being out of town. Patient is aware that she needs to schedule and appt with PA Breeback. Please contact patient at [phone].

## 2024-05-26 NOTE — Telephone Encounter (Signed)
 Let patient know lexapro  was sent to [facility].

## 2024-05-26 NOTE — Telephone Encounter (Signed)
 Is it ok to send in a 30 day prescription. Last seen in May 2024.

## 2024-05-26 NOTE — Telephone Encounter (Signed)
 Patient advised.

## 2024-06-21 ENCOUNTER — Ambulatory Visit (INDEPENDENT_AMBULATORY_CARE_PROVIDER_SITE_OTHER): Admitting: Physician Assistant

## 2024-06-21 ENCOUNTER — Other Ambulatory Visit (HOSPITAL_BASED_OUTPATIENT_CLINIC_OR_DEPARTMENT_OTHER): Payer: Self-pay

## 2024-06-21 ENCOUNTER — Encounter: Payer: Self-pay | Admitting: Physician Assistant

## 2024-06-21 VITALS — BP 128/76 | HR 70 | Ht 61.0 in | Wt 142.0 lb

## 2024-06-21 DIAGNOSIS — L814 Other melanin hyperpigmentation: Secondary | ICD-10-CM | POA: Diagnosis not present

## 2024-06-21 DIAGNOSIS — J310 Chronic rhinitis: Secondary | ICD-10-CM

## 2024-06-21 DIAGNOSIS — F3340 Major depressive disorder, recurrent, in remission, unspecified: Secondary | ICD-10-CM

## 2024-06-21 DIAGNOSIS — N952 Postmenopausal atrophic vaginitis: Secondary | ICD-10-CM

## 2024-06-21 DIAGNOSIS — Z79899 Other long term (current) drug therapy: Secondary | ICD-10-CM | POA: Diagnosis not present

## 2024-06-21 DIAGNOSIS — Z23 Encounter for immunization: Secondary | ICD-10-CM

## 2024-06-21 DIAGNOSIS — Z780 Asymptomatic menopausal state: Secondary | ICD-10-CM | POA: Diagnosis not present

## 2024-06-21 DIAGNOSIS — E785 Hyperlipidemia, unspecified: Secondary | ICD-10-CM | POA: Diagnosis not present

## 2024-06-21 DIAGNOSIS — L821 Other seborrheic keratosis: Secondary | ICD-10-CM | POA: Diagnosis not present

## 2024-06-21 DIAGNOSIS — Z1329 Encounter for screening for other suspected endocrine disorder: Secondary | ICD-10-CM | POA: Diagnosis not present

## 2024-06-21 DIAGNOSIS — Z0000 Encounter for general adult medical examination without abnormal findings: Secondary | ICD-10-CM | POA: Diagnosis not present

## 2024-06-21 DIAGNOSIS — Z129 Encounter for screening for malignant neoplasm, site unspecified: Secondary | ICD-10-CM | POA: Diagnosis not present

## 2024-06-21 DIAGNOSIS — B078 Other viral warts: Secondary | ICD-10-CM | POA: Diagnosis not present

## 2024-06-21 DIAGNOSIS — Z85828 Personal history of other malignant neoplasm of skin: Secondary | ICD-10-CM | POA: Diagnosis not present

## 2024-06-21 DIAGNOSIS — L570 Actinic keratosis: Secondary | ICD-10-CM | POA: Diagnosis not present

## 2024-06-21 MED ORDER — FLUTICASONE PROPIONATE 50 MCG/ACT NA SUSP
2.0000 | Freq: Every day | NASAL | 1 refills | Status: AC
  Filled 2024-06-21: qty 16, 30d supply, fill #0

## 2024-06-21 MED ORDER — ESCITALOPRAM OXALATE 10 MG PO TABS
10.0000 mg | ORAL_TABLET | Freq: Every day | ORAL | 3 refills | Status: AC
  Filled 2024-06-21 – 2024-09-08 (×3): qty 90, 90d supply, fill #0
  Filled 2024-12-13: qty 90, 90d supply, fill #1
  Filled 2024-12-14: qty 90, 90d supply, fill #0

## 2024-06-21 MED ORDER — ESTRADIOL 0.1 MG/GM VA CREA
1.0000 | TOPICAL_CREAM | VAGINAL | 3 refills | Status: AC
Start: 2024-06-21 — End: ?
  Filled 2024-06-21: qty 42.5, 43d supply, fill #0

## 2024-06-21 NOTE — Progress Notes (Unsigned)
 Complete physical exam  Patient: Donna Ford   DOB: 1966/01/12   58 y.o. Female  MRN: [id number]  Subjective:    Chief Complaint  Patient presents with   Annual Exam    Fasting labs    Serine Kea is a 58 y.o. female who presents today for a complete physical exam. She reports consuming a {diet types:17450} diet. {types:19826} She generally feels {DESC; WELL/FAIRLY WELL/POORLY:18703}. She reports sleeping {DESC; WELL/FAIRLY WELL/POORLY:18703}. She {does/does not:200015} have additional problems to discuss today.    Most recent fall risk assessment:    04/01/2022    2:29 PM  Fall Risk   Falls in the past year? 0  Number falls in past yr: 0  Injury with Fall? 0  Risk for fall due to : No Fall Risks  Follow up Falls evaluation completed      Data saved with a previous flowsheet row definition     Most recent depression screenings:    04/07/2023   11:00 AM 04/01/2022    3:13 PM  PHQ 2/9 Scores  PHQ - 2 Score 0 0  PHQ- 9 Score 0 0    {VISON DENTAL STD PSA (Optional):27386}  {History (Optional):23778}  Patient Care Team: Elier Zellars L, PA-C as PCP - General (Family Medicine)   Outpatient Medications Prior to Visit  Medication Sig   [DISCONTINUED] escitalopram  (LEXAPRO ) 10 MG tablet Take 1 tablet (10 mg total) by mouth daily.   [DISCONTINUED] estradiol  (ESTRACE ) 0.1 MG/GM vaginal cream Place 2 grams vaginally every other day.   [DISCONTINUED] fexofenadine  (24HR ALLERGY RELIEF) 180 MG tablet Take 1 tablet (180 mg total) by mouth daily for 15 days.   [DISCONTINUED] fluconazole  (DIFLUCAN ) 150 MG tablet Take one tablet by mouth and repeat in 48 to 72 hours if symptoms persist.   [DISCONTINUED] montelukast  (SINGULAIR ) 10 MG tablet Take 1 tablet (10 mg total) by mouth at bedtime. (Patient not taking: Reported on 06/21/2024)   [DISCONTINUED] ondansetron  (ZOFRAN -ODT) 8 MG disintegrating tablet Take 1 tablet (8 mg total) by mouth every 8 (eight) hours as needed.    [DISCONTINUED] predniSONE  (DELTASONE ) 20 MG tablet Take 3 tablets (60 mg total) by mouth daily for 5 days.   [DISCONTINUED] scopolamine  (TRANSDERM-SCOP) 1 MG/3DAYS Place 1 patch (1.5 mg total) onto the skin every 3 (three) days.   No facility-administered medications prior to visit.    ROS        Objective:     BP 128/76   Pulse 70   Ht 5' 1 (1.549 m)   Wt 142 lb (64.4 kg)   SpO2 99%   BMI 26.83 kg/m  {Vitals History (Optional):23777}  Physical Exam   No results found for any visits on 06/21/24. {Show previous labs (optional):23779}    Assessment & Plan:    Routine Health Maintenance and Physical Exam  Immunization History  Administered Date(s) Administered   Influenza-Unspecified 09/20/2016, 10/02/2020   MMR 07/15/2014    Health Maintenance  Topic Date Due   DTaP/Tdap/Td (1 - Tdap) Never done   Pneumococcal Vaccine 79-87 Years old (1 of 2 - PCV) Never done   Hepatitis B Vaccines (1 of 3 - 19+ 3-dose series) Never done   Zoster Vaccines- Shingrix (1 of 2) Never done   COVID-19 Vaccine (1 - 2024-25 season) 07/07/2024 (Originally 08/08/2023)   INFLUENZA VACCINE  07/07/2024   Colonoscopy  05/14/2025   MAMMOGRAM  01/04/2026   Hepatitis C Screening  Completed   HIV Screening  Completed   HPV VACCINES  Aged Out   Meningococcal B Vaccine  Aged Out    Discussed health benefits of physical activity, and encouraged her to engage in regular exercise appropriate for her age and condition.  Problem List Items Addressed This Visit       Unprioritized   Recurrent major depressive disorder, in remission (HCC)   Relevant Medications   escitalopram  (LEXAPRO ) 10 MG tablet   Dyslipidemia   Relevant Orders   Lipid panel   Other Visit Diagnoses       Routine physical examination    -  Primary   Relevant Orders   CBC with Differential/Platelet   CMP14+EGFR   Lipid panel   VITAMIN D  25 Hydroxy (Vit-D Deficiency, Fractures)   TSH + free T4     Atrophic vaginitis        Relevant Medications   estradiol  (ESTRACE ) 0.1 MG/GM vaginal cream     Post-menopausal       Relevant Orders   VITAMIN D  25 Hydroxy (Vit-D Deficiency, Fractures)     Medication management       Relevant Orders   CBC with Differential/Platelet   CMP14+EGFR   VITAMIN D  25 Hydroxy (Vit-D Deficiency, Fractures)     Thyroid  disorder screening       Relevant Orders   TSH + free T4      Return in about 1 year (around 06/21/2025).     Eulia Hatcher, PA-C

## 2024-06-21 NOTE — Patient Instructions (Addendum)
 Consider shingles vaccine  Health Maintenance, Female Adopting a healthy lifestyle and getting preventive care are important in promoting health and wellness. Ask your health care provider about: The right schedule for you to have regular tests and exams. Things you can do on your own to prevent diseases and keep yourself healthy. What should I know about diet, weight, and exercise? Eat a healthy diet  Eat a diet that includes plenty of vegetables, fruits, low-fat dairy products, and lean protein. Do not eat a lot of foods that are high in solid fats, added sugars, or sodium. Maintain a healthy weight Body mass index (BMI) is used to identify weight problems. It estimates body fat based on height and weight. Your health care provider can help determine your BMI and help you achieve or maintain a healthy weight. Get regular exercise Get regular exercise. This is one of the most important things you can do for your health. Most adults should: Exercise for at least 150 minutes each week. The exercise should increase your heart rate and make you sweat (moderate-intensity exercise). Do strengthening exercises at least twice a week. This is in addition to the moderate-intensity exercise. Spend less time sitting. Even light physical activity can be beneficial. Watch cholesterol and blood lipids Have your blood tested for lipids and cholesterol at 60 years of age, then have this test every 5 years. Have your cholesterol levels checked more often if: Your lipid or cholesterol levels are high. You are older than 60 years of age. You are at high risk for heart disease. What should I know about cancer screening? Depending on your health history and family history, you may need to have cancer screening at various ages. This may include screening for: Breast cancer. Cervical cancer. Colorectal cancer. Skin cancer. Lung cancer. What should I know about heart disease, diabetes, and high blood  pressure? Blood pressure and heart disease High blood pressure causes heart disease and increases the risk of stroke. This is more likely to develop in people who have high blood pressure readings or are overweight. Have your blood pressure checked: Every 3-5 years if you are 49-17 years of age. Every year if you are 83 years old or older. Diabetes Have regular diabetes screenings. This checks your fasting blood sugar level. Have the screening done: Once every three years after age 15 if you are at a normal weight and have a low risk for diabetes. More often and at a younger age if you are overweight or have a high risk for diabetes. What should I know about preventing infection? Hepatitis B If you have a higher risk for hepatitis B, you should be screened for this virus. Talk with your health care provider to find out if you are at risk for hepatitis B infection. Hepatitis C Testing is recommended for: Everyone born from [birth year] through [id number]. Anyone with known risk factors for hepatitis C. Sexually transmitted infections (STIs) Get screened for STIs, including gonorrhea and chlamydia, if: You are sexually active and are younger than 60 years of age. You are older than 60 years of age and your health care provider tells you that you are at risk for this type of infection. Your sexual activity has changed since you were last screened, and you are at increased risk for chlamydia or gonorrhea. Ask your health care provider if you are at risk. Ask your health care provider about whether you are at high risk for HIV. Your health care provider may recommend a prescription medicine  to help prevent HIV infection. If you choose to take medicine to prevent HIV, you should first get tested for HIV. You should then be tested every 3 months for as long as you are taking the medicine. Pregnancy If you are about to stop having your period (premenopausal) and you may become pregnant, seek counseling before you  get pregnant. Take 400 to 800 micrograms (mcg) of folic acid  every day if you become pregnant. Ask for birth control (contraception) if you want to prevent pregnancy. Osteoporosis and menopause Osteoporosis is a disease in which the bones lose minerals and strength with aging. This can result in bone fractures. If you are 83 years old or older, or if you are at risk for osteoporosis and fractures, ask your health care provider if you should: Be screened for bone loss. Take a calcium  or vitamin D  supplement to lower your risk of fractures. Be given hormone replacement therapy (HRT) to treat symptoms of menopause. Follow these instructions at home: Alcohol use Do not drink alcohol if: Your health care provider tells you not to drink. You are pregnant, may be pregnant, or are planning to become pregnant. If you drink alcohol: Limit how much you have to: 0-1 drink a day. Know how much alcohol is in your drink. In the [place] one drink equals one 12 oz bottle of beer (355 mL), one 5 oz glass of wine (148 mL), or one 1 oz glass of hard liquor (44 mL). Lifestyle Do not use any products that contain nicotine or tobacco. These products include cigarettes, chewing tobacco, and vaping devices, such as e-cigarettes. If you need help quitting, ask your health care provider. Do not use street drugs. Do not share needles. Ask your health care provider for help if you need support or information about quitting drugs. General instructions Schedule regular health, dental, and eye exams. Stay current with your vaccines. Tell your health care provider if: You often feel depressed. You have ever been abused or do not feel safe at home. Summary Adopting a healthy lifestyle and getting preventive care are important in promoting health and wellness. Follow your health care provider's instructions about healthy diet, exercising, and getting tested or screened for diseases. Follow your health care provider's  instructions on monitoring your cholesterol and blood pressure. This information is not intended to replace advice given to you by your health care provider. Make sure you discuss any questions you have with your health care provider. Document Revised: 04/14/2021 Document Reviewed: 04/14/2021 Elsevier Patient Education  2024 [employer].

## 2024-06-22 ENCOUNTER — Ambulatory Visit: Payer: Self-pay | Admitting: Physician Assistant

## 2024-06-22 LAB — CBC WITH DIFFERENTIAL/PLATELET
Basophils Absolute: 0.1 x10E3/uL (ref 0.0–0.2)
Basos: 1 %
EOS (ABSOLUTE): 0.2 x10E3/uL (ref 0.0–0.4)
Eos: 4 %
Hematocrit: 41.5 % (ref 34.0–46.6)
Hemoglobin: 13.2 g/dL (ref 11.1–15.9)
Immature Grans (Abs): 0 x10E3/uL (ref 0.0–0.1)
Immature Granulocytes: 0 %
Lymphocytes Absolute: 2.4 x10E3/uL (ref 0.7–3.1)
Lymphs: 48 %
MCH: 28.7 pg (ref 26.6–33.0)
MCHC: 31.8 g/dL (ref 31.5–35.7)
MCV: 90 fL (ref 79–97)
Monocytes Absolute: 0.4 x10E3/uL (ref 0.1–0.9)
Monocytes: 7 %
Neutrophils Absolute: 2.1 x10E3/uL (ref 1.4–7.0)
Neutrophils: 40 %
Platelets: 289 x10E3/uL (ref 150–450)
RBC: 4.6 x10E6/uL (ref 3.77–5.28)
RDW: 13 % (ref 11.7–15.4)
WBC: 5.1 x10E3/uL (ref 3.4–10.8)

## 2024-06-22 LAB — LIPID PANEL
Chol/HDL Ratio: 6.1 ratio — ABNORMAL HIGH (ref 0.0–4.4)
Cholesterol, Total: 303 mg/dL — ABNORMAL HIGH (ref 100–199)
HDL: 50 mg/dL (ref >39)
LDL Chol Calc (NIH): 221 mg/dL — ABNORMAL HIGH (ref 0–99)
Triglycerides: 167 mg/dL — ABNORMAL HIGH (ref 0–149)
VLDL Cholesterol Cal: 32 mg/dL (ref 5–40)

## 2024-06-22 LAB — VITAMIN D 25 HYDROXY (VIT D DEFICIENCY, FRACTURES): Vit D, 25-Hydroxy: 45.5 ng/mL (ref 30.0–100.0)

## 2024-06-22 LAB — CMP14+EGFR
ALT: 15 IU/L (ref 0–32)
AST: 16 IU/L (ref 0–40)
Albumin: 4.2 g/dL (ref 3.8–4.9)
Alkaline Phosphatase: 72 IU/L (ref 44–121)
BUN/Creatinine Ratio: 20 (ref 9–23)
BUN: 14 mg/dL (ref 6–24)
Bilirubin Total: 0.3 mg/dL (ref 0.0–1.2)
CO2: 22 mmol/L (ref 20–29)
Calcium: 9.7 mg/dL (ref 8.7–10.2)
Chloride: 102 mmol/L (ref 96–106)
Creatinine, Ser: 0.69 mg/dL (ref 0.57–1.00)
Globulin, Total: 2.1 g/dL (ref 1.5–4.5)
Glucose: 85 mg/dL (ref 70–99)
Potassium: 4.5 mmol/L (ref 3.5–5.2)
Sodium: 139 mmol/L (ref 134–144)
Total Protein: 6.3 g/dL (ref 6.0–8.5)
eGFR: 100 mL/min/1.73 (ref >59)

## 2024-06-22 LAB — TSH+FREE T4
Free T4: 0.92 ng/dL (ref 0.82–1.77)
TSH: 1.33 u[IU]/mL (ref 0.450–4.500)

## 2024-06-22 NOTE — Progress Notes (Signed)
 Donna Ford,   Kidney, liver, glucose look good.  Vitamin D  looks good.  Thyroid  normal.  LDL is not to goal and very elevated.   Overall 10 year cardiovascular risk is still low but I would still suggested trying another statin.   SABRA.The 10-year ASCVD risk score (Arnett DK, et al., 2019) is: 4.5%   Values used to calculate the score:     Age: 58 years     Clincally relevant sex: Female     Is Non-Hispanic African American: No     Diabetic: No     Tobacco smoker: No     Systolic Blood Pressure: 128 mmHg     Is BP treated: No     HDL Cholesterol: 50 mg/dL     Total Cholesterol: 303 mg/dL

## 2024-06-23 ENCOUNTER — Ambulatory Visit: Admitting: Obstetrics and Gynecology

## 2024-06-23 ENCOUNTER — Encounter: Payer: Self-pay | Admitting: Physician Assistant

## 2024-06-23 DIAGNOSIS — N952 Postmenopausal atrophic vaginitis: Secondary | ICD-10-CM | POA: Insufficient documentation

## 2024-08-08 ENCOUNTER — Encounter: Payer: Self-pay | Admitting: Sports Medicine

## 2024-08-23 ENCOUNTER — Ambulatory Visit (INDEPENDENT_AMBULATORY_CARE_PROVIDER_SITE_OTHER): Admitting: Obstetrics and Gynecology

## 2024-08-23 ENCOUNTER — Encounter: Payer: Self-pay | Admitting: Obstetrics and Gynecology

## 2024-08-23 VITALS — BP 111/69 | HR 77

## 2024-08-23 DIAGNOSIS — N8110 Cystocele, unspecified: Secondary | ICD-10-CM

## 2024-08-23 NOTE — Progress Notes (Signed)
 [facility] Return Visit  SUBJECTIVE  History of Present Illness: Donna Ford is a 58 y.o. female seen in follow-up for stage II POP. History of posterior repair and USLS in 2021 at [facility].  Had a lot of pain after surgery so she is not sure she wants another one right now. She is using the #3 RWS and estrogen cream. The pessary is working well for her right now. She is not wearing it all the time but just as needed.  She does not have the pessary in currently.   Has rare issues with incontinence with sneeze or waiting too long. She is not bothered by this incontinence.  Past Medical History: Patient  has no past medical history on file.   Past Surgical History: She  has a past surgical history that includes Vaginal hysterectomy and Pelvic floor repair.   Medications: She has a current medication list which includes the following prescription(s): cyanocobalamin, escitalopram , estradiol , fluticasone , fish oil, and vitamin d  (cholecalciferol).   Allergies: Patient is allergic to atorvastatin .   Social History: Patient  reports that she has never smoked. She has never used smokeless tobacco. She reports that she does not drink alcohol and does not use drugs.     OBJECTIVE     Physical Exam: Vitals:   08/23/24 1417  BP: 111/69  Pulse: 77   Gen: No apparent distress, A&O x 3.  Detailed Urogynecologic Evaluation:  Normal external genitalia. On speculum, normal vaginal mucosa. On bimanual, no masses present.   Prior exam showed:  POP-Q  1                                            Aa   0                                           Ba  -6.5                                              C   4                                            Gh  4                                            Pb  8                                            tvl   -3                                            Ap  -3  Bp                                                  D      ASSESSMENT AND PLAN    Donna Ford is a 58 y.o. with:  1. Prolapse of anterior vaginal wall     Prolapse of anterior vaginal wall Assessment & Plan: - She appears to have isolated anterior vaginal wall prolapse with good apical and posterior suspension from her prior surgery.  - We discussed that she can continue the pessary for as long as she wants. Will have her return at regular intervals for check ups since she is self- maintaing.  - We discussed that if she did have surgery, could consider anterior repair vs sacrocolpopexy with mesh.     Return in about 6 months (around 02/20/2025).   Donna LOISE Caper, MD  Time spent: I spent 25 minutes dedicated to the care of this patient on the date of this encounter to include pre-visit review of records, face-to-face time with the patient and post visit documentation.

## 2024-08-23 NOTE — Assessment & Plan Note (Signed)
-   She appears to have isolated anterior vaginal wall prolapse with good apical and posterior suspension from her prior surgery.  - We discussed that she can continue the pessary for as long as she wants. Will have her return at regular intervals for check ups since she is self- maintaing.  - We discussed that if she did have surgery, could consider anterior repair vs sacrocolpopexy with mesh.

## 2024-09-08 ENCOUNTER — Other Ambulatory Visit (HOSPITAL_BASED_OUTPATIENT_CLINIC_OR_DEPARTMENT_OTHER): Payer: Self-pay

## 2024-09-08 MED ORDER — AMOXICILLIN 500 MG PO TABS
500.0000 mg | ORAL_TABLET | Freq: Three times a day (TID) | ORAL | 0 refills | Status: AC
  Filled 2024-09-08: qty 21, 7d supply, fill #0

## 2024-12-04 ENCOUNTER — Telehealth: Admitting: Nurse Practitioner

## 2024-12-04 ENCOUNTER — Other Ambulatory Visit (HOSPITAL_COMMUNITY): Payer: Self-pay

## 2024-12-04 DIAGNOSIS — J40 Bronchitis, not specified as acute or chronic: Secondary | ICD-10-CM | POA: Diagnosis not present

## 2024-12-04 MED ORDER — BENZONATATE 100 MG PO CAPS
100.0000 mg | ORAL_CAPSULE | Freq: Three times a day (TID) | ORAL | 0 refills | Status: AC | PRN
  Filled 2024-12-04: qty 30, 10d supply, fill #0

## 2024-12-04 MED ORDER — ALBUTEROL SULFATE HFA 108 (90 BASE) MCG/ACT IN AERS
2.0000 | INHALATION_SPRAY | Freq: Four times a day (QID) | RESPIRATORY_TRACT | 0 refills | Status: AC | PRN
  Filled 2024-12-04: qty 6.7, 25d supply, fill #0

## 2024-12-04 MED ORDER — PREDNISONE 20 MG PO TABS
20.0000 mg | ORAL_TABLET | Freq: Two times a day (BID) | ORAL | 0 refills | Status: AC
  Filled 2024-12-04: qty 10, 5d supply, fill #0

## 2024-12-04 NOTE — Progress Notes (Signed)
 We are sorry that you are not feeling well.  Here is how we plan to help!  Based on your presentation I believe you most likely have A cough due to a virus.  This is called viral bronchitis and is best treated by rest, plenty of fluids and control of the cough.  You may use Ibuprofen  or Tylenol  as directed to help your symptoms.     In addition you may use  Meds ordered this encounter  Medications   albuterol  (VENTOLIN  HFA) 108 (90 Base) MCG/ACT inhaler    Sig: Inhale 2 puffs into the lungs every 6 (six) hours as needed for wheezing or shortness of breath.    Dispense:  8 g    Refill:  0   predniSONE  (DELTASONE ) 20 MG tablet    Sig: Take 1 tablet (20 mg total) by mouth 2 (two) times daily with a meal for 5 days.    Dispense:  10 tablet    Refill:  0   benzonatate  (TESSALON ) 100 MG capsule    Sig: Take 1 capsule (100 mg total) by mouth 3 (three) times daily as needed.    Dispense:  30 capsule    Refill:  0      From your responses in the eVisit questionnaire you describe inflammation in the upper respiratory tract which is causing a significant cough.  This is commonly called Bronchitis and has four common causes:   Allergies Viral Infections Acid Reflux Bacterial Infection Allergies, viruses and acid reflux are treated by controlling symptoms or eliminating the cause. An example might be a cough caused by taking certain blood pressure medications. You stop the cough by changing the medication. Another example might be a cough caused by acid reflux. Controlling the reflux helps control the cough.  USE OF BRONCHODILATOR (RESCUE) INHALERS: There is a risk from using your bronchodilator too frequently.  The risk is that over-reliance on a medication which only relaxes the muscles surrounding the breathing tubes can reduce the effectiveness of medications prescribed to reduce swelling and congestion of the tubes themselves.  Although you feel brief relief from the bronchodilator inhaler,  your asthma may actually be worsening with the tubes becoming more swollen and filled with mucus.  This can delay other crucial treatments, such as oral steroid medications. If you need to use a bronchodilator inhaler daily, several times per day, you should discuss this with your provider.  There are probably better treatments that could be used to keep your asthma under control.     HOME CARE Only take medications as instructed by your medical team. Complete the entire course of an antibiotic. Drink plenty of fluids and get plenty of rest. Avoid close contacts especially the very young and the elderly Cover your mouth if you cough or cough into your sleeve. Always remember to wash your hands A steam or ultrasonic humidifier can help congestion.   GET HELP RIGHT AWAY IF: You develop worsening fever. You become short of breath You cough up blood. Your symptoms persist after you have completed your treatment plan MAKE SURE YOU  Understand these instructions. Will watch your condition. Will get help right away if you are not doing well or get worse.  Your e-visit answers were reviewed by a board certified advanced clinical practitioner to complete your personal care plan.  Depending on the condition, your plan could have included both over the counter or prescription medications. If there is a problem please reply  once you have received  a response from your provider. Your safety is important to us .  If you have drug allergies check your prescription carefully.    You can use MyChart to ask questions about todays visit, request a non-urgent call back, or ask for a work or school excuse for 24 hours related to this e-Visit. If it has been greater than 24 hours you will need to follow up with your provider, or enter a new e-Visit to address those concerns. You will get an e-mail in the next two days asking about your experience.  I hope that your e-visit has been valuable and will speed your  recovery. Thank you for using e-visits.   I have spent 5 minutes in review of e-visit questionnaire, review and updating patient chart, medical decision making and response to patient.   Lauraine Kitty, FNP

## 2024-12-05 ENCOUNTER — Other Ambulatory Visit (HOSPITAL_COMMUNITY): Payer: Self-pay

## 2024-12-13 ENCOUNTER — Other Ambulatory Visit: Payer: Self-pay | Admitting: Physician Assistant

## 2024-12-13 DIAGNOSIS — Z1231 Encounter for screening mammogram for malignant neoplasm of breast: Secondary | ICD-10-CM

## 2024-12-14 ENCOUNTER — Other Ambulatory Visit (HOSPITAL_COMMUNITY): Payer: Self-pay

## 2024-12-14 ENCOUNTER — Other Ambulatory Visit: Payer: Self-pay

## 2024-12-18 ENCOUNTER — Encounter: Payer: Self-pay | Admitting: *Deleted

## 2024-12-18 ENCOUNTER — Other Ambulatory Visit: Payer: Self-pay

## 2025-01-05 ENCOUNTER — Ambulatory Visit

## 2025-01-05 DIAGNOSIS — Z1231 Encounter for screening mammogram for malignant neoplasm of breast: Secondary | ICD-10-CM | POA: Diagnosis not present

## 2025-01-09 ENCOUNTER — Ambulatory Visit: Payer: Self-pay | Admitting: Physician Assistant

## 2025-01-09 NOTE — Progress Notes (Signed)
 Normal mammogram. Follow up in 1 year.

## 2025-01-10 NOTE — Telephone Encounter (Unsigned)
 Copied from CRM [id number]. Topic: Clinical - Lab/Test Results >> Jan 10, 2025 11:13 AM Montie POUR wrote: Reason for CRM:  Please call Donna Ford to review her mammogram results. Her number is [phone]. I tried to call clinic and no answer. Thanks

## 2025-06-20 ENCOUNTER — Encounter: Admitting: Physician Assistant
